# Patient Record
Sex: Male | Born: 1980 | Race: White | Hispanic: No | Marital: Married | Smoking: Current some day smoker
Health system: Southern US, Community
[De-identification: ages and names within clinical notes are randomized; demographics above are authoritative.]

## PROBLEM LIST (undated history)

## (undated) DIAGNOSIS — R519 Headache, unspecified: Secondary | ICD-10-CM

## (undated) DIAGNOSIS — G4730 Sleep apnea, unspecified: Secondary | ICD-10-CM

## (undated) DIAGNOSIS — R51 Headache: Secondary | ICD-10-CM

## (undated) DIAGNOSIS — M1990 Unspecified osteoarthritis, unspecified site: Secondary | ICD-10-CM

## (undated) DIAGNOSIS — I10 Essential (primary) hypertension: Secondary | ICD-10-CM

## (undated) DIAGNOSIS — F419 Anxiety disorder, unspecified: Secondary | ICD-10-CM

## (undated) HISTORY — PX: OTHER SURGICAL HISTORY: SHX169

## (undated) HISTORY — PX: LUMBAR FUSION: SHX111

## (undated) HISTORY — PX: PARTIAL NEPHRECTOMY: SHX414

---

## 2010-05-05 ENCOUNTER — Inpatient Hospital Stay (HOSPITAL_COMMUNITY): Admission: RE | Admit: 2010-05-05 | Discharge: 2010-05-08 | Payer: Self-pay | Admitting: Neurological Surgery

## 2010-06-21 ENCOUNTER — Encounter
Admission: RE | Admit: 2010-06-21 | Discharge: 2010-06-21 | Payer: Self-pay | Source: Home / Self Care | Attending: Neurological Surgery | Admitting: Neurological Surgery

## 2010-08-17 ENCOUNTER — Ambulatory Visit
Admission: RE | Admit: 2010-08-17 | Discharge: 2010-08-17 | Disposition: A | Discharge status: Home / Self Care | Payer: Worker's Compensation | Source: Ambulatory Visit | Attending: Neurological Surgery | Admitting: Neurological Surgery

## 2010-08-17 ENCOUNTER — Other Ambulatory Visit: Payer: Self-pay | Admitting: Neurological Surgery

## 2010-08-17 DIAGNOSIS — M549 Dorsalgia, unspecified: Secondary | ICD-10-CM

## 2010-08-24 LAB — COMPREHENSIVE METABOLIC PANEL
ALT: 52 U/L (ref 0–53)
AST: 31 U/L (ref 0–37)
Albumin: 4.3 g/dL (ref 3.5–5.2)
CO2: 30 mEq/L (ref 19–32)
Chloride: 101 mEq/L (ref 96–112)
GFR calc Af Amer: >60 mL/min (ref >60)
GFR calc non Af Amer: >60 mL/min (ref >60)
Potassium: 4 mEq/L (ref 3.5–5.1)
Sodium: 139 mEq/L (ref 135–145)
Total Bilirubin: 0.6 mg/dL (ref 0.3–1.2)

## 2010-08-24 LAB — CBC
Hemoglobin: 15.2 g/dL (ref 13.0–17.0)
RBC: 4.91 MIL/uL (ref 4.22–5.81)
WBC: 8.1 10*3/uL (ref 4.0–10.5)

## 2010-08-24 LAB — DIFFERENTIAL
Basophils Absolute: 0 10*3/uL (ref 0.0–0.1)
Eosinophils Absolute: 0.4 10*3/uL (ref 0.0–0.7)
Eosinophils Relative: 4 % (ref 0–5)
Monocytes Absolute: 0.5 10*3/uL (ref 0.1–1.0)

## 2010-08-24 LAB — ABO/RH: ABO/RH(D): A POS

## 2010-08-24 LAB — PROTIME-INR: Prothrombin Time: 12.9 seconds (ref 11.6–15.2)

## 2010-08-24 LAB — SURGICAL PCR SCREEN
MRSA, PCR: NEGATIVE
Staphylococcus aureus: NEGATIVE

## 2010-08-24 LAB — TYPE AND SCREEN: Antibody Screen: NEGATIVE

## 2010-11-29 ENCOUNTER — Ambulatory Visit
Admission: RE | Admit: 2010-11-29 | Discharge: 2010-11-29 | Disposition: A | Discharge status: Home / Self Care | Payer: Worker's Compensation | Source: Ambulatory Visit | Attending: Neurological Surgery | Admitting: Neurological Surgery

## 2010-11-29 ENCOUNTER — Other Ambulatory Visit: Payer: Self-pay | Admitting: Neurological Surgery

## 2010-11-29 DIAGNOSIS — M549 Dorsalgia, unspecified: Secondary | ICD-10-CM

## 2011-03-14 ENCOUNTER — Ambulatory Visit
Admission: RE | Admit: 2011-03-14 | Discharge: 2011-03-14 | Disposition: A | Discharge status: Home / Self Care | Payer: Worker's Compensation | Source: Ambulatory Visit | Attending: Neurological Surgery | Admitting: Neurological Surgery

## 2011-03-14 ENCOUNTER — Other Ambulatory Visit: Payer: Self-pay | Admitting: Neurological Surgery

## 2011-03-14 DIAGNOSIS — IMO0002 Reserved for concepts with insufficient information to code with codable children: Secondary | ICD-10-CM

## 2011-07-04 ENCOUNTER — Encounter (HOSPITAL_COMMUNITY): Payer: Self-pay | Admitting: Pharmacy Technician

## 2011-07-05 ENCOUNTER — Encounter (HOSPITAL_COMMUNITY): Payer: Self-pay

## 2011-07-05 ENCOUNTER — Encounter (HOSPITAL_COMMUNITY)
Admission: RE | Admit: 2011-07-05 | Discharge: 2011-07-05 | Disposition: A | Discharge status: Home / Self Care | Payer: Worker's Compensation | Source: Ambulatory Visit | Attending: Neurological Surgery | Admitting: Neurological Surgery

## 2011-07-05 HISTORY — DX: Sleep apnea, unspecified: G47.30

## 2011-07-05 HISTORY — DX: Essential (primary) hypertension: I10

## 2011-07-05 HISTORY — DX: Anxiety disorder, unspecified: F41.9

## 2011-07-05 LAB — CBC
HCT: 45.2 % (ref 39.0–52.0)
MCH: 31.7 pg (ref 26.0–34.0)
MCHC: 35 g/dL (ref 30.0–36.0)
MCV: 90.8 fL (ref 78.0–100.0)
Platelets: 200 10*3/uL (ref 150–400)
RDW: 13.3 % (ref 11.5–15.5)

## 2011-07-05 LAB — BASIC METABOLIC PANEL
CO2: 28 mEq/L (ref 19–32)
Calcium: 10 mg/dL (ref 8.4–10.5)
Creatinine, Ser: 1.11 mg/dL (ref 0.50–1.35)
GFR calc Af Amer: >90 mL/min (ref >90)
GFR calc non Af Amer: 89 mL/min — ABNORMAL LOW (ref >90)

## 2011-07-05 LAB — DIFFERENTIAL
Basophils Absolute: 0 10*3/uL (ref 0.0–0.1)
Basophils Relative: 0 % (ref 0–1)
Eosinophils Absolute: 0.3 10*3/uL (ref 0.0–0.7)
Eosinophils Relative: 3 % (ref 0–5)
Monocytes Absolute: 0.7 10*3/uL (ref 0.1–1.0)

## 2011-07-05 NOTE — Pre-Procedure Instructions (Signed)
20 DASH CARDARELLI  07/05/2011   Your procedure is scheduled on:  Thursday July 07, 2011  Report to [facility] at 1030 AM.  Call this number if you have problems the morning of surgery: [phone]   Remember:   Do not eat food:After Midnight.  May have clear liquids:until Midnight . (up to 6:30am)  Clear liquids include soda, tea, black coffee, apple or grape juice, broth.  Take these medicines the morning of surgery with A SIP OF WATER: xanax, amlodipine, celexa, gabapentin, hydrocodone   Do not wear jewelry, make-up or nail polish.  Do not wear lotions, powders, or perfumes. You may wear deodorant.  Do not shave 48 hours prior to surgery.  Do not bring valuables to the hospital.  Contacts, dentures or bridgework may not be worn into surgery.  Leave suitcase in the car. After surgery it may be brought to your room.  For patients admitted to the hospital, checkout time is 11:00 AM the day of discharge.   Patients discharged the day of surgery will not be allowed to drive home.  Name and phone number of your driver: Archer Moist  Special Instructions: CHG Shower Use Special Wash: 1/2 bottle night before surgery and 1/2 bottle morning of surgery.   Please read over the following fact sheets that you were given: Pain Booklet, Coughing and Deep Breathing, Blood Transfusion Information, MRSA Information and Surgical Site Infection Prevention

## 2011-07-05 NOTE — Progress Notes (Signed)
Echo requested from [facility]. CXR, EKG requested from [facility] [place]. Request cardiology notes from [facility] [place].

## 2011-07-06 MED ORDER — CEFAZOLIN SODIUM-DEXTROSE 2-3 GM-% IV SOLR
2.0000 g | INTRAVENOUS | Status: AC
  Administered 2011-07-07: 2 g via INTRAVENOUS
  Filled 2011-07-06: qty 50

## 2011-07-07 ENCOUNTER — Ambulatory Visit (HOSPITAL_COMMUNITY): Payer: Worker's Compensation

## 2011-07-07 ENCOUNTER — Encounter (HOSPITAL_COMMUNITY)
Admission: RE | Disposition: A | Discharge status: Home / Self Care | Payer: Self-pay | Source: Ambulatory Visit | Attending: Neurological Surgery

## 2011-07-07 ENCOUNTER — Encounter (HOSPITAL_COMMUNITY): Payer: Self-pay | Admitting: Anesthesiology

## 2011-07-07 ENCOUNTER — Ambulatory Visit (HOSPITAL_COMMUNITY): Payer: Worker's Compensation | Admitting: Anesthesiology

## 2011-07-07 ENCOUNTER — Ambulatory Visit (HOSPITAL_COMMUNITY)
Admission: RE | Admit: 2011-07-07 | Discharge: 2011-07-08 | Disposition: A | Discharge status: Home / Self Care | Payer: Worker's Compensation | Source: Ambulatory Visit | Attending: Neurological Surgery | Admitting: Neurological Surgery

## 2011-07-07 ENCOUNTER — Encounter (HOSPITAL_COMMUNITY): Payer: Self-pay | Admitting: Neurological Surgery

## 2011-07-07 ENCOUNTER — Encounter (HOSPITAL_COMMUNITY): Payer: Self-pay | Admitting: *Deleted

## 2011-07-07 ENCOUNTER — Other Ambulatory Visit: Payer: Self-pay

## 2011-07-07 DIAGNOSIS — Y831 Surgical operation with implant of artificial internal device as the cause of abnormal reaction of the patient, or of later complication, without mention of misadventure at the time of the procedure: Secondary | ICD-10-CM | POA: Insufficient documentation

## 2011-07-07 DIAGNOSIS — G4730 Sleep apnea, unspecified: Secondary | ICD-10-CM | POA: Insufficient documentation

## 2011-07-07 DIAGNOSIS — T8489XA Other specified complication of internal orthopedic prosthetic devices, implants and grafts, initial encounter: Secondary | ICD-10-CM | POA: Insufficient documentation

## 2011-07-07 DIAGNOSIS — I10 Essential (primary) hypertension: Secondary | ICD-10-CM | POA: Insufficient documentation

## 2011-07-07 DIAGNOSIS — F411 Generalized anxiety disorder: Secondary | ICD-10-CM | POA: Insufficient documentation

## 2011-07-07 DIAGNOSIS — M549 Dorsalgia, unspecified: Secondary | ICD-10-CM

## 2011-07-07 DIAGNOSIS — I499 Cardiac arrhythmia, unspecified: Secondary | ICD-10-CM | POA: Insufficient documentation

## 2011-07-07 DIAGNOSIS — Z01812 Encounter for preprocedural laboratory examination: Secondary | ICD-10-CM | POA: Insufficient documentation

## 2011-07-07 SURGERY — POSTERIOR LUMBAR FUSION 1 WITH HARDWARE REMOVAL
Anesthesia: General | Site: Back | Wound class: Clean

## 2011-07-07 MED ORDER — SODIUM CHLORIDE 0.9 % IJ SOLN: 3.0000 mL | INTRAMUSCULAR | Status: DC | PRN

## 2011-07-07 MED ORDER — DEXAMETHASONE SODIUM PHOSPHATE 10 MG/ML IJ SOLN
10.0000 mg | Freq: Once | INTRAMUSCULAR | Status: AC
  Administered 2011-07-07: 10 mg via INTRAVENOUS
  Filled 2011-07-07: qty 1

## 2011-07-07 MED ORDER — CEFAZOLIN SODIUM 1-5 GM-% IV SOLN
1.0000 g | Freq: Three times a day (TID) | INTRAVENOUS | Status: AC
  Administered 2011-07-07 (×2): 1 g via INTRAVENOUS
  Filled 2011-07-07 (×2): qty 50

## 2011-07-07 MED ORDER — SODIUM CHLORIDE 0.9 % IV SOLN
INTRAVENOUS | Status: AC
  Filled 2011-07-07: qty 500

## 2011-07-07 MED ORDER — HYDROMORPHONE HCL PF 1 MG/ML IJ SOLN
INTRAMUSCULAR | Status: AC
  Filled 2011-07-07: qty 1

## 2011-07-07 MED ORDER — 0.9 % SODIUM CHLORIDE (POUR BTL) OPTIME
TOPICAL | Status: DC | PRN
  Administered 2011-07-07: 1000 mL

## 2011-07-07 MED ORDER — SODIUM CHLORIDE 0.9 % IJ SOLN
3.0000 mL | Freq: Two times a day (BID) | INTRAMUSCULAR | Status: DC
  Administered 2011-07-07 (×2): 3 mL via INTRAVENOUS

## 2011-07-07 MED ORDER — PHENOL 1.4 % MT LIQD: 1.0000 | OROMUCOSAL | Status: DC | PRN

## 2011-07-07 MED ORDER — MORPHINE SULFATE 4 MG/ML IJ SOLN: 1.0000 mg | INTRAMUSCULAR | Status: DC | PRN

## 2011-07-07 MED ORDER — LISINOPRIL 10 MG PO TABS
10.0000 mg | ORAL_TABLET | Freq: Every day | ORAL | Status: DC
  Administered 2011-07-07: 10 mg via ORAL
  Filled 2011-07-07 (×2): qty 1

## 2011-07-07 MED ORDER — CITALOPRAM HYDROBROMIDE 10 MG PO TABS
10.0000 mg | ORAL_TABLET | Freq: Every day | ORAL | Status: DC
  Administered 2011-07-07 (×2): 10 mg via ORAL
  Filled 2011-07-07 (×2): qty 1

## 2011-07-07 MED ORDER — BUPIVACAINE HCL (PF) 0.25 % IJ SOLN
INTRAMUSCULAR | Status: DC | PRN
  Administered 2011-07-07: 7 mL

## 2011-07-07 MED ORDER — ONDANSETRON HCL 4 MG/2ML IJ SOLN
INTRAMUSCULAR | Status: DC | PRN
  Administered 2011-07-07: 4 mg via INTRAVENOUS

## 2011-07-07 MED ORDER — KETOROLAC TROMETHAMINE 30 MG/ML IJ SOLN
INTRAMUSCULAR | Status: DC | PRN
  Administered 2011-07-07: 30 mg via INTRAVENOUS

## 2011-07-07 MED ORDER — ALPRAZOLAM 0.25 MG PO TABS: 0.2500 mg | ORAL_TABLET | Freq: Every day | ORAL | Status: DC | PRN

## 2011-07-07 MED ORDER — ONDANSETRON HCL 4 MG/2ML IJ SOLN
4.0000 mg | INTRAMUSCULAR | Status: DC | PRN
  Filled 2011-07-07: qty 2

## 2011-07-07 MED ORDER — GLYCOPYRROLATE 0.2 MG/ML IJ SOLN
INTRAMUSCULAR | Status: DC | PRN
  Administered 2011-07-07: .4 mg via INTRAVENOUS

## 2011-07-07 MED ORDER — POTASSIUM CHLORIDE IN NACL 20-0.9 MEQ/L-% IV SOLN
INTRAVENOUS | Status: DC
  Filled 2011-07-07 (×3): qty 1000

## 2011-07-07 MED ORDER — PROMETHAZINE HCL 25 MG/ML IJ SOLN: 6.2500 mg | INTRAMUSCULAR | Status: DC | PRN

## 2011-07-07 MED ORDER — LACTATED RINGERS IV SOLN
INTRAVENOUS | Status: DC | PRN
  Administered 2011-07-07 (×2): via INTRAVENOUS

## 2011-07-07 MED ORDER — SURGIFOAM 100 EX MISC
CUTANEOUS | Status: DC | PRN
  Administered 2011-07-07: 11:00:00 via TOPICAL

## 2011-07-07 MED ORDER — ACETAMINOPHEN 325 MG PO TABS: 650.0000 mg | ORAL_TABLET | ORAL | Status: DC | PRN

## 2011-07-07 MED ORDER — NEOSTIGMINE METHYLSULFATE 1 MG/ML IJ SOLN
INTRAMUSCULAR | Status: DC | PRN
  Administered 2011-07-07: 3 mg via INTRAVENOUS

## 2011-07-07 MED ORDER — HYDROMORPHONE HCL PF 1 MG/ML IJ SOLN
0.2500 mg | INTRAMUSCULAR | Status: DC | PRN
Start: 2011-07-07 — End: 2011-07-07
  Administered 2011-07-07 (×4): 0.5 mg via INTRAVENOUS

## 2011-07-07 MED ORDER — ROCURONIUM BROMIDE 100 MG/10ML IV SOLN
INTRAVENOUS | Status: DC | PRN
  Administered 2011-07-07: 5 mg via INTRAVENOUS
  Administered 2011-07-07: 60 mg via INTRAVENOUS

## 2011-07-07 MED ORDER — SENNA 8.6 MG PO TABS
1.0000 | ORAL_TABLET | Freq: Two times a day (BID) | ORAL | Status: DC
  Administered 2011-07-07: 8.6 mg via ORAL
  Filled 2011-07-07 (×3): qty 1

## 2011-07-07 MED ORDER — MEPERIDINE HCL 25 MG/ML IJ SOLN: 6.2500 mg | INTRAMUSCULAR | Status: DC | PRN

## 2011-07-07 MED ORDER — ACETAMINOPHEN 650 MG RE SUPP: 650.0000 mg | RECTAL | Status: DC | PRN

## 2011-07-07 MED ORDER — PROMETHAZINE HCL 25 MG PO TABS: 25.0000 mg | ORAL_TABLET | Freq: Four times a day (QID) | ORAL | Status: DC | PRN

## 2011-07-07 MED ORDER — AMLODIPINE BESYLATE 2.5 MG PO TABS
2.5000 mg | ORAL_TABLET | Freq: Every day | ORAL | Status: DC
  Administered 2011-07-07: 2.5 mg via ORAL
  Filled 2011-07-07 (×2): qty 1

## 2011-07-07 MED ORDER — MENTHOL 3 MG MT LOZG: 1.0000 | LOZENGE | OROMUCOSAL | Status: DC | PRN

## 2011-07-07 MED ORDER — BACITRACIN 50000 UNITS IM SOLR
INTRAMUSCULAR | Status: AC
  Filled 2011-07-07: qty 1

## 2011-07-07 MED ORDER — SODIUM CHLORIDE 0.9 % IR SOLN
Status: DC | PRN
  Administered 2011-07-07: 11:00:00

## 2011-07-07 MED ORDER — HYDROCODONE-ACETAMINOPHEN 5-325 MG PO TABS
1.0000 | ORAL_TABLET | ORAL | Status: DC | PRN
  Administered 2011-07-07: 2 via ORAL
  Filled 2011-07-07: qty 2

## 2011-07-07 MED ORDER — MELOXICAM 7.5 MG PO TABS
7.5000 mg | ORAL_TABLET | Freq: Two times a day (BID) | ORAL | Status: DC | PRN
  Filled 2011-07-07: qty 1

## 2011-07-07 MED ORDER — PROMETHAZINE HCL 25 MG/ML IJ SOLN: 25.0000 mg | Freq: Four times a day (QID) | INTRAMUSCULAR | Status: DC | PRN

## 2011-07-07 MED ORDER — MIDAZOLAM HCL 5 MG/5ML IJ SOLN
INTRAMUSCULAR | Status: DC | PRN
  Administered 2011-07-07: 2 mg via INTRAVENOUS

## 2011-07-07 MED ORDER — FENTANYL CITRATE 0.05 MG/ML IJ SOLN
INTRAMUSCULAR | Status: DC | PRN
  Administered 2011-07-07: 50 ug via INTRAVENOUS
  Administered 2011-07-07: 100 ug via INTRAVENOUS
  Administered 2011-07-07: 50 ug via INTRAVENOUS

## 2011-07-07 MED ORDER — ZOLPIDEM TARTRATE 5 MG PO TABS: 10.0000 mg | ORAL_TABLET | Freq: Every evening | ORAL | Status: DC | PRN

## 2011-07-07 MED ORDER — PROPOFOL 10 MG/ML IV EMUL
INTRAVENOUS | Status: DC | PRN
  Administered 2011-07-07: 250 mg via INTRAVENOUS

## 2011-07-07 MED ORDER — GABAPENTIN 600 MG PO TABS
600.0000 mg | ORAL_TABLET | Freq: Three times a day (TID) | ORAL | Status: DC
  Administered 2011-07-07 (×2): 600 mg via ORAL
  Filled 2011-07-07 (×5): qty 1

## 2011-07-07 MED ORDER — KETOROLAC TROMETHAMINE 30 MG/ML IJ SOLN: 15.0000 mg | Freq: Once | INTRAMUSCULAR | Status: DC | PRN

## 2011-07-07 MED ORDER — CYCLOBENZAPRINE HCL 10 MG PO TABS
10.0000 mg | ORAL_TABLET | Freq: Three times a day (TID) | ORAL | Status: DC | PRN
  Administered 2011-07-07: 10 mg via ORAL
  Filled 2011-07-07: qty 1

## 2011-07-07 SURGICAL SUPPLY — 45 items
BAG DECANTER FOR FLEXI CONT (MISCELLANEOUS) ×2 IMPLANT
BENZOIN TINCTURE PRP APPL 2/3 (GAUZE/BANDAGES/DRESSINGS) ×2 IMPLANT
BLADE SURG ROTATE 9660 (MISCELLANEOUS) IMPLANT
BUR MATCHSTICK NEURO 3.0 LAGG (BURR) ×2 IMPLANT
CANISTER SUCTION 2500CC (MISCELLANEOUS) ×2 IMPLANT
CLOSURE STERI STRIP 1/2 X4 (GAUZE/BANDAGES/DRESSINGS) ×2 IMPLANT
CLOTH BEACON ORANGE TIMEOUT ST (SAFETY) ×2 IMPLANT
CONT SPEC 4OZ CLIKSEAL STRL BL (MISCELLANEOUS) ×4 IMPLANT
COVER BACK TABLE 24X17X13 BIG (DRAPES) IMPLANT
COVER TABLE BACK 60X90 (DRAPES) ×2 IMPLANT
DRAPE C-ARM 42X72 X-RAY (DRAPES) ×4 IMPLANT
DRAPE LAPAROTOMY 100X72X124 (DRAPES) ×2 IMPLANT
DRAPE POUCH INSTRU U-SHP 10X18 (DRAPES) ×2 IMPLANT
DRAPE SURG 17X23 STRL (DRAPES) ×2 IMPLANT
DRESSING TELFA 8X3 (GAUZE/BANDAGES/DRESSINGS) ×2 IMPLANT
DRSG OPSITE 4X5.5 SM (GAUZE/BANDAGES/DRESSINGS) ×2 IMPLANT
DURAPREP 26ML APPLICATOR (WOUND CARE) ×2 IMPLANT
ELECT REM PT RETURN 9FT ADLT (ELECTROSURGICAL) ×2
ELECTRODE REM PT RTRN 9FT ADLT (ELECTROSURGICAL) ×1 IMPLANT
EVACUATOR 1/8 PVC DRAIN (DRAIN) ×4 IMPLANT
GAUZE SPONGE 4X4 16PLY XRAY LF (GAUZE/BANDAGES/DRESSINGS) IMPLANT
GLOVE BIO SURGEON STRL SZ8 (GLOVE) ×4 IMPLANT
GOWN BRE IMP SLV AUR LG STRL (GOWN DISPOSABLE) IMPLANT
GOWN BRE IMP SLV AUR XL STRL (GOWN DISPOSABLE) ×4 IMPLANT
GOWN STRL REIN 2XL LVL4 (GOWN DISPOSABLE) IMPLANT
HEMOSTAT POWDER KIT SURGIFOAM (HEMOSTASIS) IMPLANT
KIT BASIN OR (CUSTOM PROCEDURE TRAY) ×2 IMPLANT
KIT ROOM TURNOVER OR (KITS) ×2 IMPLANT
NEEDLE HYPO 25X1 1.5 SAFETY (NEEDLE) ×2 IMPLANT
NS IRRIG 1000ML POUR BTL (IV SOLUTION) ×2 IMPLANT
PACK LAMINECTOMY NEURO (CUSTOM PROCEDURE TRAY) ×2 IMPLANT
PAD ARMBOARD 7.5X6 YLW CONV (MISCELLANEOUS) ×6 IMPLANT
SPONGE LAP 4X18 X RAY DECT (DISPOSABLE) IMPLANT
SPONGE SURGIFOAM ABS GEL 100 (HEMOSTASIS) ×2 IMPLANT
STRIP CLOSURE SKIN 1/2X4 (GAUZE/BANDAGES/DRESSINGS) ×4 IMPLANT
SUT VIC AB 0 CT1 18XCR BRD8 (SUTURE) ×1 IMPLANT
SUT VIC AB 0 CT1 8-18 (SUTURE) ×1
SUT VIC AB 2-0 CP2 18 (SUTURE) ×2 IMPLANT
SUT VIC AB 3-0 SH 8-18 (SUTURE) ×4 IMPLANT
SYR 20ML ECCENTRIC (SYRINGE) ×2 IMPLANT
TOWEL OR 17X24 6PK STRL BLUE (TOWEL DISPOSABLE) ×2 IMPLANT
TOWEL OR 17X26 10 PK STRL BLUE (TOWEL DISPOSABLE) ×2 IMPLANT
TRAP SPECIMEN MUCOUS 40CC (MISCELLANEOUS) IMPLANT
TRAY FOLEY CATH 14FRSI W/METER (CATHETERS) ×2 IMPLANT
WATER STERILE IRR 1000ML POUR (IV SOLUTION) ×2 IMPLANT

## 2011-07-07 NOTE — H&P (Signed)
Subjective: Patient is a 31 y.o. male admitted for removal of painful hardware. Onset of symptoms was several months ago, unchanged since that time.  The pain is rated moderate, and is located at the across the lower back. He is status post PLIF at L5-S1 at L4-5 over a year ago. The pain is described as aching and occurs intermittently. The symptoms have been progressive. Symptoms are exacerbated by exercise. MRI or CT showed solid fusion at both levels.   Past Medical History  Diagnosis Date  . Sleep apnea     Uses CPAP sleep study 6-8 month at [facility] Copy requested  . Hypertension     Echo done at [facility], Also seen a cardiologist at [facility] for one visit  . Dysrhythmia     Unsure rhythm attributed to sleep apena  . Anxiety     Past Surgical History  Procedure Date  . Partial nephrectomy   . Lumbar fusion   . Orif hand     Prior to Admission medications   Medication Sig Start Date End Date Taking? Authorizing Provider  ALPRAZolam (XANAX) 0.25 MG tablet Take 0.25 mg by mouth daily as needed. anxiety   Yes Historical Provider, MD  amLODipine (NORVASC) 2.5 MG tablet Take 2.5 mg by mouth daily.   Yes Historical Provider, MD  citalopram (CELEXA) 10 MG tablet Take 10 mg by mouth daily.   Yes Historical Provider, MD  cyclobenzaprine (FLEXERIL) 10 MG tablet Take 10 mg by mouth 3 (three) times daily as needed. Muscle spasm   Yes Historical Provider, MD  gabapentin (NEURONTIN) 600 MG tablet Take 600 mg by mouth 3 (three) times daily.   Yes Historical Provider, MD  HYDROcodone-acetaminophen (VICODIN) 5-500 MG per tablet Take 1-2 tablets by mouth every 8 (eight) hours as needed. pain   Yes Historical Provider, MD  lisinopril (PRINIVIL,ZESTRIL) 10 MG tablet Take 10 mg by mouth daily.   Yes Historical Provider, MD  meloxicam (MOBIC) 7.5 MG tablet Take 7.5 mg by mouth 2 (two) times daily as needed. pain   Yes Historical Provider, MD  promethazine (PHENERGAN) 25 MG  tablet Take 25 mg by mouth every 6 (six) hours as needed. Nausea   Yes Historical Provider, MD   Allergies  Allergen Reactions  . Hctz (Hydrochlorothiazide) Other (See Comments)    Hypotension, Syncope    History  Substance Use Topics  . Smoking status: Current Some Day Smoker  . Smokeless tobacco: Current User    Types: Chew, Snuff  . Alcohol Use:      1 drink a drink    Family History  Problem Relation Age of Onset  . Anesthesia problems Neg Hx      Review of Systems  Positive ROS: neg  All other systems have been reviewed and were otherwise negative with the exception of those mentioned in the HPI and as above.  Objective: Vital signs in last 24 hours: Temp:  [98 F (36.7 C)] 98 F (36.7 C) (01/24 0712) Pulse Rate:  [98] 98  (01/24 0712) Resp:  [20] 20  (01/24 0712) BP: (141)/(82) 141/82 mmHg (01/24 0712) SpO2:  [95 %] 95 % (01/24 0712)  General Appearance: Alert, cooperative, no distress, appears stated age Head: Normocephalic, without obvious abnormality, atraumatic Eyes: PERRL, conjunctiva/corneas clear, EOM's intact, fundi benign, both eyes      Ears: Normal TM's and external ear canals, both ears Throat: Lips, mucosa, and tongue normal; teeth and gums normal Neck: Supple, symmetrical, trachea midline,  no adenopathy; thyroid: No enlargement/tenderness/nodules; no carotid bruit or JVD Back: Symmetric, no curvature, ROM normal, no CVA tenderness Lungs: Clear to auscultation bilaterally, respirations unlabored Heart: Regular rate and rhythm, S1 and S2 normal, no murmur, rub or gallop Abdomen: Soft, non-tender, bowel sounds active all four quadrants, no masses, no organomegaly Extremities: Extremities normal, atraumatic, no cyanosis or edema Pulses: 2+ and symmetric all extremities Skin: Skin color, texture, turgor normal, no rashes or lesions  NEUROLOGIC:   Mental status: Alert and oriented x4,  no aphasia, good attention span, fund of knowledge, and  memory Motor Exam - grossly normal Sensory Exam - grossly normal Reflexes: Normal Coordination - grossly normal Gait - grossly normal Balance - grossly normal Cranial Nerves: I: smell Not tested  II: visual acuity  OS: nl    OD: nl  II: visual fields Full to confrontation  II: pupils Equal, round, reactive to light  III,VII: ptosis None  III,IV,VI: extraocular muscles  Full ROM  V: mastication Normal  V: facial light touch sensation  Normal  V,VII: corneal reflex  Present  VII: facial muscle function - upper  Normal  VII: facial muscle function - lower Normal  VIII: hearing Not tested  IX: soft palate elevation  Normal  IX,X: gag reflex Present  XI: trapezius strength  5/5  XI: sternocleidomastoid strength 5/5  XI: neck flexion strength  5/5  XII: tongue strength  Normal    Data Review Lab Results  Component Value Date   WBC 8.5 07/05/2011   HGB 15.8 07/05/2011   HCT 45.2 07/05/2011   MCV 90.8 07/05/2011   PLT 200 07/05/2011   Lab Results  Component Value Date   NA 137 07/05/2011   K 4.1 07/05/2011   CL 100 07/05/2011   CO2 28 07/05/2011   BUN 11 07/05/2011   CREATININE 1.11 07/05/2011   GLUCOSE 97 07/05/2011   Lab Results  Component Value Date   INR 0.95 04/30/2010    Assessment/Plan: Patient admitted for lumbar reexploration with removal of hardware. Patient has failed conservative therapy.  I explained the condition and procedure to the patient and answered any questions.  Patient wishes to proceed with procedure as planned. Understands risks/ benefits and typical outcomes of procedure.   JONES,DAVID S 07/07/2011 10:09 AM

## 2011-07-07 NOTE — Anesthesia Preprocedure Evaluation (Signed)
Anesthesia Evaluation  Patient identified by MRN, date of birth, ID band Patient awake    Reviewed: Allergy & Precautions, H&P , NPO status , Patient's Chart, lab work & pertinent test results  History of Anesthesia Complications Negative for: history of anesthetic complications  Airway Mallampati: I  Neck ROM: Full    Dental  (+) Teeth Intact and Dental Advisory Given   Pulmonary sleep apnea ,  clear to auscultation        Cardiovascular hypertension, + dysrhythmias Regular Normal    Neuro/Psych Anxiety    GI/Hepatic negative GI ROS, Neg liver ROS,   Endo/Other    Renal/GU negative Renal ROS     Musculoskeletal   Abdominal   Peds  Hematology   Anesthesia Other Findings   Reproductive/Obstetrics                           Anesthesia Physical Anesthesia Plan  ASA: II  Anesthesia Plan: General   Post-op Pain Management:    Induction: Intravenous  Airway Management Planned: Oral ETT  Additional Equipment:   Intra-op Plan:   Post-operative Plan: Extubation in OR  Informed Consent: I have reviewed the patients History and Physical, chart, labs and discussed the procedure including the risks, benefits and alternatives for the proposed anesthesia with the patient or authorized representative who has indicated his/her understanding and acceptance.     Plan Discussed with: CRNA and Surgeon  Anesthesia Plan Comments:         Anesthesia Quick Evaluation

## 2011-07-07 NOTE — Op Note (Signed)
07/07/2011  12:17 PM  PATIENT:  Chad Kane  31 y.o. male  PRE-OPERATIVE DIAGNOSIS:  Painful hardware status post L4-S1 fusion  POST-OPERATIVE DIAGNOSIS:  Same  PROCEDURE:  Lumbar reexploration with exploration of L4-S1 fusion to assure no pseudoarthrosis, with removal of segmental hardware L4-S1  SURGEON:  Marikay Alar, MD  ASSISTANTS: Dr. Gerlene Fee  ANESTHESIA:   General  EBL: 100 ml  Total I/O In: 1000 [I.V.:1000] Out: 100 [Blood:100]  BLOOD ADMINISTERED:none  DRAINS: Medium Hemovac   SPECIMEN:  No Specimen  INDICATION FOR PROCEDURE: This is a 31 year old gentleman who underwent a 2 level PLIF over a year ago. He has some chronic back. Because of his youth and because of his chronic back pain I felt might be beneficial to remove the hardware. Patient understood the risks, benefits, and alternatives and potential outcomes and wished to proceed.  PROCEDURE DETAILS: The patient was taken to the operating room and after induction of adequate generalized endotracheal anesthesia he was rolled into the prone position on the Wilson frame and all pressure points were padded. His lumbar region was prepped with DuraPrep and then draped in the usual sterile fashion. 10 cc of local anesthesia was injected and his old incision was ellipsed out. I then carried my dissection down to the fascia to identify the L3 minus process. Integument dissection out laterally to identify the tulip heads of the pedicle screws. I removed the soft tissue from the surface of the tulip heads and removed the locking caps. I then removed the rods. I then pulled on the screws to assure all screws moved in unison to assess my fusion. I then removed each screw him L4-S1. Irrigated with saline solution containing bacitracin. I placed Gelfoam in the screw holes. I placed a medium Hemovac drain through separate stab incision. I then closed the muscle and the fascia with 0 Vicryl. I closed the subcutaneous and subcuticular  tissue with 2-0 and 3-0 Vicryl. The skin was closed with benzoin and Steri-Strips. The drapes were removed and a sterile dressing was applied. Patient was awakened from general anesthesia and taken to the recovery room in stable condition. At the end of the procedure all sponge needle and instrument counts were correct.  PLAN OF CARE: Admit for overnight observation  PATIENT DISPOSITION:  PACU - hemodynamically stable.   Delay start of Pharmacological VTE agent (>24hrs) due to surgical blood loss or risk of bleeding:  yes

## 2011-07-07 NOTE — Anesthesia Postprocedure Evaluation (Signed)
  Anesthesia Post-op Note  Patient: Chad Kane  Procedure(s) Performed:  POSTERIOR LUMBAR FUSION 1 WITH HARDWARE REMOVAL - Removal of lumbar hardware Biomet removal set Rm # 33 (to follow)  Patient Location: PACU  Anesthesia Type: General  Level of Consciousness: awake  Airway and Oxygen Therapy: Patient Spontanous Breathing  Post-op Pain: mild  Post-op Assessment: Post-op Vital signs reviewed  Post-op Vital Signs: stable  Complications: No apparent anesthesia complications

## 2011-07-07 NOTE — Progress Notes (Signed)
Patient ID: Chad Kane, male   DOB: [birth year], 31 y.o.   MRN: [id number]   Pt doing well post-op. Back sore. No leg sxs. Home tomorrow.

## 2011-07-07 NOTE — Preoperative (Signed)
Beta Blockers   Reason not to administer Beta Blockers:Not Applicable 

## 2011-07-07 NOTE — Plan of Care (Signed)
Problem: Consults Goal: Diagnosis - Spinal Surgery Outcome: Completed/Met Date Met:  07/07/11 Thoraco/Lumbar Spine Fusion Removal of hardware

## 2011-07-07 NOTE — Transfer of Care (Signed)
Immediate Anesthesia Transfer of Care Note  Patient: Chad Kane  Procedure(s) Performed:  POSTERIOR LUMBAR FUSION 1 WITH HARDWARE REMOVAL - Removal of lumbar hardware Biomet removal set Rm # 33 (to follow)  Patient Location: PACU  Anesthesia Type: General  Level of Consciousness: awake, alert  and oriented  Airway & Oxygen Therapy: Patient Spontanous Breathing and Patient connected to nasal cannula oxygen  Post-op Assessment: Report given to PACU RN and Post -op Vital signs reviewed and stable  Post vital signs: Reviewed  Complications: No apparent anesthesia complications

## 2011-07-07 NOTE — Anesthesia Procedure Notes (Signed)
Procedure Name: Intubation Date/Time: 07/07/2011 10:52 AM Performed by: Julianne Rice K Pre-anesthesia Checklist: Emergency Drugs available, Patient identified, Timeout performed, Suction available and Patient being monitored Patient Re-evaluated:Patient Re-evaluated prior to inductionOxygen Delivery Method: Circle System Utilized Preoxygenation: Pre-oxygenation with 100% oxygen Intubation Type: IV induction Ventilation: Mask ventilation without difficulty Laryngoscope Size: Mac and 4 Grade View: Grade II Tube type: Oral Tube size: 8.5 mm Number of attempts: 1 Airway Equipment and Method: stylet Secured at: 23 cm Tube secured with: Tape Dental Injury: Teeth and Oropharynx as per pre-operative assessment

## 2011-07-08 NOTE — Discharge Summary (Signed)
Physician Discharge Summary  Patient ID: Chad Kane MRN: [id number] DOB/AGE: [birth year] 29 y.o.  Admit date: 07/07/2011 Discharge date: 07/08/2011  Admission Diagnoses: painful hardware    Discharge Diagnoses: same   Discharged Condition: good  Hospital Course: The patient was admitted on 07/07/2011 and taken to the operating room where the patient underwent hardware removal. The patient tolerated the procedure well and was taken to the recovery room and then to the floor in stable condition. The hospital course was routine. There were no complications. The wound remained clean dry and intact. The patient remained afebrile with stable vital signs, and tolerated a regular diet. The patient continued to increase activities, and pain was well controlled with oral pain medications.   Consults: None  Significant Diagnostic Studies:  Results for orders placed during the hospital encounter of 07/05/11  SURGICAL PCR SCREEN      Component Value Range   MRSA, PCR NEGATIVE  NEGATIVE    Staphylococcus aureus NEGATIVE  NEGATIVE   CBC      Component Value Range   WBC 8.5  4.0 - 10.5 (K/uL)   RBC 4.98  4.22 - 5.81 (MIL/uL)   Hemoglobin 15.8  13.0 - 17.0 (g/dL)   HCT 46.9  62.9 - 52.8 (%)   MCV 90.8  78.0 - 100.0 (fL)   MCH 31.7  26.0 - 34.0 (pg)   MCHC 35.0  30.0 - 36.0 (g/dL)   RDW 41.3  24.4 - 01.0 (%)   Platelets 200  150 - 400 (K/uL)  DIFFERENTIAL      Component Value Range   Neutrophils Relative 65  43 - 77 (%)   Neutro Abs 5.5  1.7 - 7.7 (K/uL)   Lymphocytes Relative 23  12 - 46 (%)   Lymphs Abs 1.9  0.7 - 4.0 (K/uL)   Monocytes Relative 8  3 - 12 (%)   Monocytes Absolute 0.7  0.1 - 1.0 (K/uL)   Eosinophils Relative 3  0 - 5 (%)   Eosinophils Absolute 0.3  0.0 - 0.7 (K/uL)   Basophils Relative 0  0 - 1 (%)   Basophils Absolute 0.0  0.0 - 0.1 (K/uL)  BASIC METABOLIC PANEL      Component Value Range   Sodium 137  135 - 145 (mEq/L)   Potassium 4.1  3.5 - 5.1 (mEq/L)   Chloride 100  96 - 112 (mEq/L)   CO2 28  19 - 32 (mEq/L)   Glucose, Bld 97  70 - 99 (mg/dL)   BUN 11  6 - 23 (mg/dL)   Creatinine, Ser 2.72  0.50 - 1.35 (mg/dL)   Calcium 53.6  8.4 - 10.5 (mg/dL)   GFR calc non Af Amer 89 (*) >90 (mL/min)   GFR calc Af Amer >90  >90 (mL/min)    Dg Chest 2 View  07/07/2011  *RADIOLOGY REPORT*  Clinical Data: Sleep apnea.  Hypertension.  Dysrhythmia.  Posterior lumbar fusion, preoperative.  CHEST - 2 VIEW  Comparison: 04/30/2010  Findings: Cardiac and mediastinal contours appear normal.  The lungs appear clear.  No pleural effusion is identified.  IMPRESSION:  No significant abnormality identified.  Original Report Authenticated By: Dellia Cloud, M.D.    Antibiotics:  Anti-infectives     Start     Dose/Rate Route Frequency Ordered Stop   07/07/11 1500   ceFAZolin (ANCEF) IVPB 1 g/50 mL premix        1 g 100 mL/hr over 30 Minutes Intravenous 3 times per day  07/07/11 1421 07/07/11 2201   07/07/11 1037   bacitracin 50,000 Units in sodium chloride irrigation 0.9 % 500 mL irrigation  Status:  Discontinued          As needed 07/07/11 1037 07/07/11 1222   07/07/11 1031   bacitracin 16109 UNITS injection     Comments: [name], AMY: cabinet override         07/07/11 1031 07/07/11 2244   07/06/11 1445   ceFAZolin (ANCEF) IVPB 2 g/50 mL premix        2 g 100 mL/hr over 30 Minutes Intravenous 60 min pre-op 07/06/11 1433 07/07/11 1045          Discharge Exam: Blood pressure 137/84, pulse 94, temperature 97.8 F (36.6 C), temperature source Oral, resp. rate 18, SpO2 96.00%. Neurologic: Grossly normal Wound:CDI  Discharge Medications:   Medication List  As of 07/08/2011  8:32 AM   TAKE these medications         ALPRAZolam 0.25 MG tablet   Commonly known as: XANAX   Take 0.25 mg by mouth daily as needed. anxiety      amLODipine 2.5 MG tablet   Commonly known as: NORVASC   Take 2.5 mg by mouth daily.      citalopram 10 MG tablet   Commonly  known as: CELEXA   Take 10 mg by mouth daily.      cyclobenzaprine 10 MG tablet   Commonly known as: FLEXERIL   Take 10 mg by mouth 3 (three) times daily as needed. Muscle spasm      gabapentin 600 MG tablet   Commonly known as: NEURONTIN   Take 600 mg by mouth 3 (three) times daily.      HYDROcodone-acetaminophen 5-500 MG per tablet   Commonly known as: VICODIN   Take 1-2 tablets by mouth every 8 (eight) hours as needed. pain      lisinopril 10 MG tablet   Commonly known as: PRINIVIL,ZESTRIL   Take 10 mg by mouth daily.      meloxicam 7.5 MG tablet   Commonly known as: MOBIC   Take 7.5 mg by mouth 2 (two) times daily as needed. pain      promethazine 25 MG tablet   Commonly known as: PHENERGAN   Take 25 mg by mouth every 6 (six) hours as needed. Nausea            Disposition: home   Final Dx: hardware removal, lumbar  Discharge Orders    Future Orders Please Complete By Expires   Diet - low sodium heart healthy      Increase activity slowly      Driving Restrictions      Comments:   1 week   Call MD for:  temperature >100.4      Call MD for:  persistant nausea and vomiting      Call MD for:  severe uncontrolled pain      Call MD for:  redness, tenderness, or signs of infection (pain, swelling, redness, odor or green/yellow discharge around incision site)      Call MD for:  difficulty breathing, headache or visual disturbances         Follow-up Information    Follow up with Chad Trinidad S, MD in 3 weeks.   Contact information:   [address] [place] [address] [phone]           Signed: Tia Alert 07/08/2011, 8:32 AM

## 2017-05-12 ENCOUNTER — Other Ambulatory Visit: Payer: Self-pay | Admitting: Student

## 2017-05-12 DIAGNOSIS — M5442 Lumbago with sciatica, left side: Secondary | ICD-10-CM

## 2017-05-12 DIAGNOSIS — Z77018 Contact with and (suspected) exposure to other hazardous metals: Secondary | ICD-10-CM

## 2017-05-12 DIAGNOSIS — M5412 Radiculopathy, cervical region: Secondary | ICD-10-CM

## 2017-05-12 DIAGNOSIS — G8929 Other chronic pain: Secondary | ICD-10-CM

## 2017-05-12 DIAGNOSIS — M5441 Lumbago with sciatica, right side: Secondary | ICD-10-CM

## 2017-06-20 ENCOUNTER — Ambulatory Visit
Admission: RE | Admit: 2017-06-20 | Discharge: 2017-06-20 | Disposition: A | Discharge status: Home / Self Care | Payer: No Typology Code available for payment source | Source: Ambulatory Visit | Attending: Student | Admitting: Student

## 2017-06-20 DIAGNOSIS — Z77018 Contact with and (suspected) exposure to other hazardous metals: Secondary | ICD-10-CM

## 2017-06-20 DIAGNOSIS — M5412 Radiculopathy, cervical region: Secondary | ICD-10-CM

## 2017-06-20 DIAGNOSIS — G8929 Other chronic pain: Secondary | ICD-10-CM

## 2017-06-20 DIAGNOSIS — M5442 Lumbago with sciatica, left side: Secondary | ICD-10-CM

## 2017-06-20 DIAGNOSIS — M5441 Lumbago with sciatica, right side: Secondary | ICD-10-CM

## 2017-06-20 MED ORDER — GADOBENATE DIMEGLUMINE 529 MG/ML IV SOLN
20.0000 mL | Freq: Once | INTRAVENOUS | Status: AC | PRN
  Administered 2017-06-20: 20 mL via INTRAVENOUS

## 2017-06-27 ENCOUNTER — Emergency Department (HOSPITAL_COMMUNITY)
Admission: EM | Admit: 2017-06-27 | Discharge: 2017-06-28 | Disposition: A | Discharge status: Home / Self Care | Payer: Medicaid Other | Attending: Emergency Medicine | Admitting: Emergency Medicine

## 2017-06-27 ENCOUNTER — Encounter (HOSPITAL_COMMUNITY): Payer: Self-pay | Admitting: Emergency Medicine

## 2017-06-27 ENCOUNTER — Other Ambulatory Visit: Payer: Self-pay

## 2017-06-27 DIAGNOSIS — Z79899 Other long term (current) drug therapy: Secondary | ICD-10-CM | POA: Insufficient documentation

## 2017-06-27 DIAGNOSIS — M79602 Pain in left arm: Secondary | ICD-10-CM | POA: Insufficient documentation

## 2017-06-27 DIAGNOSIS — I10 Essential (primary) hypertension: Secondary | ICD-10-CM | POA: Insufficient documentation

## 2017-06-27 DIAGNOSIS — F17200 Nicotine dependence, unspecified, uncomplicated: Secondary | ICD-10-CM | POA: Diagnosis not present

## 2017-06-27 DIAGNOSIS — M79601 Pain in right arm: Secondary | ICD-10-CM | POA: Diagnosis not present

## 2017-06-27 DIAGNOSIS — R202 Paresthesia of skin: Secondary | ICD-10-CM | POA: Diagnosis present

## 2017-06-27 LAB — BASIC METABOLIC PANEL
Anion gap: 11 (ref 5–15)
BUN: 10 mg/dL (ref 6–20)
CALCIUM: 9.3 mg/dL (ref 8.9–10.3)
CO2: 21 mmol/L — ABNORMAL LOW (ref 22–32)
CREATININE: 1.17 mg/dL (ref 0.61–1.24)
Chloride: 103 mmol/L (ref 101–111)
GFR calc Af Amer: >60 mL/min (ref >60)
GFR calc non Af Amer: >60 mL/min (ref >60)
GLUCOSE: 115 mg/dL — AB (ref 65–99)
POTASSIUM: 4 mmol/L (ref 3.5–5.1)
Sodium: 135 mmol/L (ref 135–145)

## 2017-06-27 LAB — CBC WITH DIFFERENTIAL/PLATELET
Basophils Absolute: 0 10*3/uL (ref 0.0–0.1)
Basophils Relative: 0 %
EOS PCT: 2 %
Eosinophils Absolute: 0.2 10*3/uL (ref 0.0–0.7)
HCT: 47.1 % (ref 39.0–52.0)
Hemoglobin: 16.5 g/dL (ref 13.0–17.0)
LYMPHS ABS: 1.6 10*3/uL (ref 0.7–4.0)
Lymphocytes Relative: 15 %
MCH: 31.6 pg (ref 26.0–34.0)
MCHC: 35 g/dL (ref 30.0–36.0)
MCV: 90.2 fL (ref 78.0–100.0)
MONO ABS: 0.7 10*3/uL (ref 0.1–1.0)
Monocytes Relative: 6 %
Neutro Abs: 8.5 10*3/uL — ABNORMAL HIGH (ref 1.7–7.7)
Neutrophils Relative %: 77 %
PLATELETS: 191 10*3/uL (ref 150–400)
RBC: 5.22 MIL/uL (ref 4.22–5.81)
RDW: 13 % (ref 11.5–15.5)
WBC: 11 10*3/uL — ABNORMAL HIGH (ref 4.0–10.5)

## 2017-06-27 MED ORDER — PREDNISONE 20 MG PO TABS
60.0000 mg | ORAL_TABLET | Freq: Once | ORAL | Status: AC
  Administered 2017-06-27: 60 mg via ORAL
  Filled 2017-06-27: qty 3

## 2017-06-27 MED ORDER — IBUPROFEN 200 MG PO TABS
600.0000 mg | ORAL_TABLET | Freq: Once | ORAL | Status: AC
  Administered 2017-06-27: 600 mg via ORAL
  Filled 2017-06-27: qty 1

## 2017-06-27 MED ORDER — DIAZEPAM 2 MG PO TABS
2.0000 mg | ORAL_TABLET | Freq: Once | ORAL | Status: AC
  Administered 2017-06-27: 2 mg via ORAL
  Filled 2017-06-27: qty 1

## 2017-06-27 NOTE — ED Notes (Signed)
Pt. Stated, they had a MRI last week, since the MRI the numbness has gotten worse.

## 2017-06-27 NOTE — ED Triage Notes (Signed)
Pt. Stated, I broke my back in 2009 and then I had a fusion and have had issues with cramping and numbness for the last week. Its all over I can barely walk.  Im having to use suppository to boop cause my stomach will hurt but I dont have the urge to boop. I can pee ok . If Im laying down its not as bad. I also take anxiety and BP for panic issues. Called Dr. Yetta BarreJones office and the said to come here.

## 2017-06-27 NOTE — ED Provider Notes (Signed)
[facility] EMERGENCY [facility] Provider Note   CSN: [id number] Arrival date & time: 06/27/17  1750     History   Chief Complaint Chief Complaint  Patient presents with  . Back Pain  . Weakness    HPI Chad Kane is a 37 y.o. male who presents with upper back pain and neck pain. PMH significant for chronic back pain s/p lumbar fusion in 2013 by Dr. Yetta BarreJones. He states that over the past several months he has had issues with numbness of the bilateral upper and lower extremities as well as diffuse muscle spasms. Over the past week he has had worsening numbness/tingling and spasms. He feels like his whole body spasms including his chest and abdomen. He reports he has had issues with constipation. He normally has several BM a day and today has had one which was very hard. He does not take narcotics because he doesn't like the way they make him feel. He has been taking Valium for spasms which has helped but his symptoms were so severe that he called Dr. Barnett ApplebaumJones's office today who advised him to come to the ED. He had a MRI of the cervical and lumbar spine last week which showed post-op changes with fusion of L4-L5 and L5-S1 without residual stenosis and mild segment disease at L3-L4 without significant stenosis of neural impingement. MRI of cervical spine showed multilevel severe DDD. [facility]  HPI  Past Medical History:  Diagnosis Date  . Anxiety   . Dysrhythmia    Unsure rhythm attributed to sleep apena  . Hypertension    [facility], [facility] one [facility]  . Sleep apnea    [facility]    There are no active problems to display for this patient.   Past Surgical History:  Procedure Laterality Date  . LUMBAR FUSION    . ORIF hand    . PARTIAL NEPHRECTOMY         Home Medications    Prior to Admission medications     Medication Sig Start Date End Date Taking? Authorizing Provider  busPIRone (BUSPAR) 10 MG tablet Take 10 mg by mouth 2 (two) times daily.   Yes [provider]  diazepam (VALIUM) 5 MG tablet Take 5 mg by mouth every 6 (six) hours as needed for anxiety.   Yes [provider]  lisinopril (PRINIVIL,ZESTRIL) 10 MG tablet Take 10 mg by mouth daily.   Yes [provider]  meloxicam (MOBIC) 7.5 MG tablet Take 7.5 mg by mouth 2 (two) times daily as needed. pain   Yes [provider]  traMADol (ULTRAM) 50 MG tablet Take 50 mg by mouth daily as needed. 06/01/17  Yes [provider]  HYDROcodone-acetaminophen (VICODIN) 5-500 MG per tablet Take 1-2 tablets by mouth every 8 (eight) hours as needed. pain    [provider]    Family History Family History  Problem Relation Age of Onset  . Anesthesia problems Neg Hx     Social History Social History   Tobacco Use  . Smoking status: Current Some Day Smoker  . Smokeless tobacco: Current User    Types: Chew, Snuff  Substance Use Topics  . Alcohol use: Not on file    Comment: 1 drink a drink  . Drug use: No     Allergies   Hctz [hydrochlorothiazide]  Review of Systems Review of Systems  Constitutional: Negative for fever.  Cardiovascular: Negative for chest pain.  Gastrointestinal: Positive for constipation. Negative for abdominal pain, nausea and vomiting.  Genitourinary: Negative for difficulty urinating.  Musculoskeletal: Positive for back pain, myalgias and neck pain. Negative for gait problem.  Neurological: Positive for numbness. Negative for weakness.  All other systems reviewed and are negative.    Physical Exam Updated Vital Signs BP 135/67 (BP Location: Left Arm)   Pulse 75   Temp 99.8 F (37.7 C) (Oral)   Resp 17   Ht 6\' 1"  (1.854 m)   Wt 104.3 kg (230 lb)   SpO2 97%   BMI 30.34 kg/m   Physical Exam  Constitutional: He is oriented to person, place, and time.  He appears well-developed and well-nourished. No distress.  Calm, cooperative  HENT:  Head: Normocephalic and atraumatic.  Eyes: Conjunctivae are normal. Pupils are equal, round, and reactive to light. Right eye exhibits no discharge. Left eye exhibits no discharge. No scleral icterus.  Neck: Normal range of motion.  Cardiovascular: Normal rate and regular rhythm. Exam reveals no gallop and no friction rub.  No murmur heard. Pulmonary/Chest: Effort normal and breath sounds normal. No stridor. No respiratory distress. He has no wheezes. He has no rales. He exhibits no tenderness.  Abdominal: Soft. Bowel sounds are normal. He exhibits no distension. There is no tenderness.  Musculoskeletal:  No obvious spasms. Prior lumbar surgical scar noted  5/5 upper and lower extremity strength Subjective tingling of the bilateral upper and lower extremities Ambulatory   Neurological: He is alert and oriented to person, place, and time.  Skin: Skin is warm and dry.  Psychiatric: He has a normal mood and affect. His behavior is normal.  Nursing note and vitals reviewed.    ED Treatments / Results  Labs (all labs ordered are listed, but only abnormal results are displayed) Labs Reviewed  CBC WITH DIFFERENTIAL/PLATELET - Abnormal; Notable for the following components:      Result Value   WBC 11.0 (*)    Neutro Abs 8.5 (*)    All other components within normal limits  BASIC METABOLIC PANEL - Abnormal; Notable for the following components:   CO2 21 (*)    Glucose, Bld 115 (*)    All other components within normal limits    EKG  EKG Interpretation None       Radiology No results found.  Procedures Procedures (including critical care time)  Medications Ordered in ED Medications  predniSONE (DELTASONE) tablet 60 mg (60 mg Oral Given 06/27/17 2338)  ibuprofen (ADVIL,MOTRIN) tablet 600 mg (600 mg Oral Given 06/27/17 2338)  diazepam (VALIUM) tablet 2 mg (2 mg Oral Given 06/27/17 2338)   oxyCODONE-acetaminophen (PERCOCET/ROXICET) 5-325 MG per tablet 1 tablet (1 tablet Oral Given 06/28/17 0230)     Initial Impression / Assessment and Plan / ED Course  I have reviewed the triage vital signs and the nursing notes.  Pertinent labs & imaging results that were available during my care of the patient were reviewed by me and considered in my medical decision making (see chart for details).  37 year old male presents with worsening numbness/tingling and spasms of the bilateral upper and lower extremities. He is not having difficulty having a BM although is able to urinate. Exam is unremarkable. He has normal strength and is ambulatory. His complaints are primarily subjective. Discussed with Dr. Preston Fleeting. Will obtain MRI of C-spine which is pending at shift change. Patient  care was transferred to S Upstill PA-C at shift change who will dispo accordingly  Final Clinical Impressions(s) / ED Diagnoses   Final diagnoses:  Paresthesia and pain of both upper extremities  Paresthesia of both lower extremities    ED Discharge Orders    None       Bethel Born, PA-C 06/28/17 1616    Nira Conn, MD 06/30/17 [phone]

## 2017-06-27 NOTE — ED Notes (Signed)
Patient educated about not driving or performing other critical tasks (such as operating heavy machinery, caring for infant/toddler/child) due to sedative nature of narcotic medications received while in the ED.  Pt/caregiver verbalized understanding.   

## 2017-06-28 ENCOUNTER — Emergency Department (HOSPITAL_COMMUNITY): Payer: Medicaid Other

## 2017-06-28 MED ORDER — OXYCODONE-ACETAMINOPHEN 5-325 MG PO TABS: 1.0000 | ORAL_TABLET | ORAL | 0 refills | Status: AC | PRN

## 2017-06-28 MED ORDER — OXYCODONE-ACETAMINOPHEN 5-325 MG PO TABS
1.0000 | ORAL_TABLET | Freq: Once | ORAL | Status: AC
  Administered 2017-06-28: 1 via ORAL
  Filled 2017-06-28: qty 1

## 2017-06-28 NOTE — ED Notes (Signed)
Went in to update patient on status at this time, pt sitting up eating a [employer] burger. I told him we are waiting on results, to refrain from eating at this time until results/reeval by provider.

## 2017-06-28 NOTE — ED Provider Notes (Signed)
Pt with chronic back pain, remote surgery to low back Upper back pain x 1 year with severe pain x 1 week Sees. Dr. Yetta BarreJones Bilateral arm and leg numbness, spastic muscles for months severe x several days MRI recently of upper and lower back - one week ago    Lumbar - fine   Cervical spine - multi-level disc disease. Non-focal neuro exam Here for further evaluation ?bowel retention No enuresis  MR c-spine pending Plan: anticipate d/ch home if MR is negative as expected. Has meds at home, does not want pain meds here.   MR is "stable since exam 06/20/17". Repeat neuro exam: equal strength in UE and LE, altered sensation that is also equal bilaterally. No cervical tenderness. Reflexes equal.  He can be discharged home to already scheduled follow up with Dr. Yetta BarreJones next Monday (07/03/17)   Elpidio AnisUpstill, Gomer France, PA-C 06/28/17 0211    Dione BoozeGlick, David, MD 06/28/17 [phone]

## 2017-06-28 NOTE — ED Notes (Signed)
Pt remains out of room in mri at this time

## 2017-06-28 NOTE — ED Notes (Signed)
Patient ambulatory to restroom down the hall with steady gait.

## 2017-06-28 NOTE — ED Notes (Signed)
Pt returned from MRI °

## 2017-07-03 ENCOUNTER — Other Ambulatory Visit: Payer: Self-pay | Admitting: Neurological Surgery

## 2017-07-19 NOTE — Pre-Procedure Instructions (Signed)
Chad Kane  07/19/2017      [id number] [place] [facility]- [place], [place] - [id number] [place] [address] Phone: [phone] Fax: [phone]    Your procedure is scheduled on Feb 14  [name]mitting at 800 A.M.  Call this number if you have problems the morning of surgery:  [phone]   Remember:  Do not eat food or drink liquids after midnight.  Take these medicines the morning of surgery with A SIP OF WATER Buspirone (Busbar), Diazepam (Valium) if needed, Loratadine (Claritin) if needed, Oxycodone (percocet) if needed  Stop taking aspirin, BC's, Goody's, Herbal medications, Fish Oil, Vitamins, Ibuprofen, Advil, Motrin, Aleve     Do not wear jewelry, make-up or nail polish.  Do not wear lotions, powders, or perfumes, or deodorant.  Do not shave 48 hours prior to surgery.  Men may shave face and neck.  Do not bring valuables to the hospital.  [facility] is not responsible for any belongings or valuables.  Contacts, dentures or bridgework may not be worn into surgery.  Leave your suitcase in the car.  After surgery it may be brought to your room.  For patients admitted to the hospital, discharge time will be determined by your treatment team.  Patients discharged the day of surgery will not be allowed to drive home.  Special instructions:  [facility] - Preparing for Surgery  Before surgery, you can play an important role.  Because skin is not sterile, your skin needs to be as free of germs as possible.  You can reduce the number of germs on you skin by washing with CHG (chlorahexidine gluconate) soap before surgery.  CHG is an antiseptic cleaner which kills germs and bonds with the skin to continue killing germs even after washing.  Please DO NOT use if you have an allergy to CHG or antibacterial soaps.  If your skin becomes reddened/irritated stop using the CHG and inform your nurse when you  arrive at Short Stay.  Do not shave (including legs and underarms) for at least 48 hours prior to the first CHG shower.  You may shave your face.  Please follow these instructions carefully:   1.  Shower with CHG Soap the night before surgery and the                                morning of Surgery.  2.  If you choose to wash your hair, wash your hair first as usual with your       normal shampoo.  3.  After you shampoo, rinse your hair and body thoroughly to remove the                      Shampoo.  4.  Use CHG as you would any other liquid soap.  You can apply chg directly       to the skin and wash gently with scrungie or a clean washcloth.  5.  Apply the CHG Soap to your body ONLY FROM THE NECK DOWN.        Do not use on open wounds or open sores.  Avoid contact with your eyes,       ears, mouth and genitals (private parts).  Wash genitals (private parts)       with your normal soap.  6.  Wash thoroughly, paying special attention to the area where your surgery        will be performed.  7.  Thoroughly rinse your body with warm water from the neck down.  8.  DO NOT shower/wash with your normal soap after using and rinsing off       the CHG Soap.  9.  Pat yourself dry with a clean towel.            10.  Wear clean pajamas.            11.  Place clean sheets on your bed the night of your first shower and do not        sleep with pets.  Day of Surgery  Do not apply any lotions/deoderants the morning of surgery.  Please wear clean clothes to the hospital/surgery center.     Please read over the following fact sheets that you were given. Pain Booklet, Coughing and Deep Breathing, MRSA Information and Surgical Site Infection Prevention

## 2017-07-20 ENCOUNTER — Encounter (HOSPITAL_COMMUNITY): Payer: Self-pay | Admitting: *Deleted

## 2017-07-20 ENCOUNTER — Other Ambulatory Visit: Payer: Self-pay

## 2017-07-20 ENCOUNTER — Encounter (HOSPITAL_COMMUNITY)
Admission: RE | Admit: 2017-07-20 | Discharge: 2017-07-20 | Disposition: A | Discharge status: Home / Self Care | Payer: Medicaid Other | Source: Ambulatory Visit | Attending: Neurological Surgery | Admitting: Neurological Surgery

## 2017-07-20 DIAGNOSIS — Z01812 Encounter for preprocedural laboratory examination: Secondary | ICD-10-CM | POA: Insufficient documentation

## 2017-07-20 LAB — BASIC METABOLIC PANEL
Anion gap: 9 (ref 5–15)
BUN: 11 mg/dL (ref 6–20)
CALCIUM: 9.3 mg/dL (ref 8.9–10.3)
CHLORIDE: 101 mmol/L (ref 101–111)
CO2: 25 mmol/L (ref 22–32)
CREATININE: 1.23 mg/dL (ref 0.61–1.24)
GFR calc Af Amer: >60 mL/min (ref >60)
Glucose, Bld: 98 mg/dL (ref 65–99)
Potassium: 4.4 mmol/L (ref 3.5–5.1)
SODIUM: 135 mmol/L (ref 135–145)

## 2017-07-20 LAB — CBC
HCT: 46 % (ref 39.0–52.0)
Hemoglobin: 15.7 g/dL (ref 13.0–17.0)
MCH: 31.6 pg (ref 26.0–34.0)
MCHC: 34.1 g/dL (ref 30.0–36.0)
MCV: 92.6 fL (ref 78.0–100.0)
PLATELETS: 147 10*3/uL — AB (ref 150–400)
RBC: 4.97 MIL/uL (ref 4.22–5.81)
RDW: 13.9 % (ref 11.5–15.5)
WBC: 7 10*3/uL (ref 4.0–10.5)

## 2017-07-20 LAB — TYPE AND SCREEN
ABO/RH(D): A POS
ANTIBODY SCREEN: NEGATIVE

## 2017-07-20 LAB — SURGICAL PCR SCREEN
MRSA, PCR: NEGATIVE
Staphylococcus aureus: NEGATIVE

## 2017-07-20 NOTE — Progress Notes (Signed)
PCP is Dr Earlie Serverhallie Minton States he saw a Cardiologist about 4 years ago due to sleep apnea. Dr [place]. States he had a stress test that was normal-request sent for stress test results. Denis ever having a card cath or echo. Denies chest pain, fever, but reports a clear productive cough at times. Has a cpap -doesn't wear.

## 2017-07-21 NOTE — Progress Notes (Addendum)
Anesthesia Chart Review:  Pt is a 37 year old male scheduled for C3-4, C4-5, C5-6 ACDF on 07/27/2017 with Marikay Alaravid Jones, MD.   - PCP is Earlie Serverhall63ie Minton, MD - Saw cardiologist Leverne Humblesomas Vybiral, MD in 2012 for c/p chest pain. Last office visit 11/17/10; echo done, no f/u recommended.   PMH includes:  HTN, OSA. Current smoker. BMI 29.5. S/p lumbar fusion 07/07/11  Medications include: lisinopril  BP 117/73   Pulse 73   Temp 36.6 C   Resp 20   Ht 6\' 1"  (1.854 m)   Wt 223 lb 6.4 oz (101.3 kg)   SpO2 98%   BMI 29.47 kg/m   Preoperative labs reviewed.    EKG 07/20/17: NSR. Increased R/S ratio in V1, consider early transition or posterior infarct  Echo 11/17/10 [facility] in [place], [place]  1. Normal LV size and systolic function. LVH and diastolic dysfunction 2. MVP, mild MR.  3. Mild TR, mild PR  If no changes, I anticipate pt can proceed with surgery as scheduled.   Rica Mastngela Krysten Veronica, FNP-BC [facility]iology Phone: [phone] 07/21/2017 5:01 PM

## 2017-07-27 ENCOUNTER — Encounter (HOSPITAL_COMMUNITY): Payer: Self-pay | Admitting: *Deleted

## 2017-07-27 ENCOUNTER — Ambulatory Visit (HOSPITAL_COMMUNITY): Payer: Medicaid Other

## 2017-07-27 ENCOUNTER — Other Ambulatory Visit: Payer: Self-pay

## 2017-07-27 ENCOUNTER — Ambulatory Visit (HOSPITAL_COMMUNITY)
Admission: RE | Disposition: A | Discharge status: Home / Self Care | Payer: Self-pay | Source: Ambulatory Visit | Attending: Neurological Surgery

## 2017-07-27 ENCOUNTER — Ambulatory Visit (HOSPITAL_COMMUNITY): Payer: Medicaid Other | Admitting: Emergency Medicine

## 2017-07-27 ENCOUNTER — Observation Stay (HOSPITAL_COMMUNITY)
Admission: RE | Admit: 2017-07-27 | Discharge: 2017-07-28 | Disposition: A | Discharge status: Home / Self Care | Payer: Medicaid Other | Source: Ambulatory Visit | Attending: Neurological Surgery | Admitting: Neurological Surgery

## 2017-07-27 DIAGNOSIS — M4802 Spinal stenosis, cervical region: Secondary | ICD-10-CM | POA: Insufficient documentation

## 2017-07-27 DIAGNOSIS — F17210 Nicotine dependence, cigarettes, uncomplicated: Secondary | ICD-10-CM | POA: Insufficient documentation

## 2017-07-27 DIAGNOSIS — I10 Essential (primary) hypertension: Secondary | ICD-10-CM | POA: Diagnosis not present

## 2017-07-27 DIAGNOSIS — F419 Anxiety disorder, unspecified: Secondary | ICD-10-CM | POA: Diagnosis not present

## 2017-07-27 DIAGNOSIS — Z79899 Other long term (current) drug therapy: Secondary | ICD-10-CM | POA: Diagnosis not present

## 2017-07-27 DIAGNOSIS — Z9989 Dependence on other enabling machines and devices: Secondary | ICD-10-CM | POA: Insufficient documentation

## 2017-07-27 DIAGNOSIS — M47892 Other spondylosis, cervical region: Secondary | ICD-10-CM | POA: Diagnosis not present

## 2017-07-27 DIAGNOSIS — M50222 Other cervical disc displacement at C5-C6 level: Secondary | ICD-10-CM | POA: Insufficient documentation

## 2017-07-27 DIAGNOSIS — M47812 Spondylosis without myelopathy or radiculopathy, cervical region: Secondary | ICD-10-CM | POA: Insufficient documentation

## 2017-07-27 DIAGNOSIS — Z419 Encounter for procedure for purposes other than remedying health state, unspecified: Secondary | ICD-10-CM

## 2017-07-27 DIAGNOSIS — M4800 Spinal stenosis, site unspecified: Secondary | ICD-10-CM | POA: Diagnosis present

## 2017-07-27 DIAGNOSIS — M4322 Fusion of spine, cervical region: Secondary | ICD-10-CM | POA: Diagnosis present

## 2017-07-27 DIAGNOSIS — Z981 Arthrodesis status: Secondary | ICD-10-CM | POA: Diagnosis not present

## 2017-07-27 DIAGNOSIS — Z880 Allergy status to penicillin: Secondary | ICD-10-CM | POA: Diagnosis not present

## 2017-07-27 DIAGNOSIS — G4730 Sleep apnea, unspecified: Secondary | ICD-10-CM | POA: Insufficient documentation

## 2017-07-27 HISTORY — DX: Headache, unspecified: R51.9

## 2017-07-27 HISTORY — DX: Unspecified osteoarthritis, unspecified site: M19.90

## 2017-07-27 HISTORY — PX: ANTERIOR CERVICAL DECOMP/DISCECTOMY FUSION: SHX1161

## 2017-07-27 HISTORY — DX: Headache: R51

## 2017-07-27 SURGERY — ANTERIOR CERVICAL DECOMPRESSION/DISCECTOMY FUSION 3 LEVELS
Anesthesia: General | Site: Spine Cervical

## 2017-07-27 MED ORDER — BUPIVACAINE HCL (PF) 0.25 % IJ SOLN
INTRAMUSCULAR | Status: AC
  Filled 2017-07-27: qty 30

## 2017-07-27 MED ORDER — OXYCODONE-ACETAMINOPHEN 5-325 MG PO TABS
1.0000 | ORAL_TABLET | ORAL | Status: DC | PRN
Start: 2017-07-27 — End: 2017-07-27
  Administered 2017-07-27: 1 via ORAL
  Filled 2017-07-27: qty 1

## 2017-07-27 MED ORDER — MIDAZOLAM HCL 5 MG/5ML IJ SOLN
INTRAMUSCULAR | Status: DC | PRN
  Administered 2017-07-27: 2 mg via INTRAVENOUS

## 2017-07-27 MED ORDER — OXYCODONE HCL 5 MG PO TABS
ORAL_TABLET | ORAL | Status: AC
  Filled 2017-07-27: qty 1

## 2017-07-27 MED ORDER — METHOCARBAMOL 500 MG PO TABS: 500.0000 mg | ORAL_TABLET | Freq: Four times a day (QID) | ORAL | Status: DC | PRN

## 2017-07-27 MED ORDER — CEFAZOLIN SODIUM-DEXTROSE 2-3 GM-%(50ML) IV SOLR
INTRAVENOUS | Status: DC | PRN
  Administered 2017-07-27: 2 g via INTRAVENOUS

## 2017-07-27 MED ORDER — ROCURONIUM BROMIDE 100 MG/10ML IV SOLN
INTRAVENOUS | Status: DC | PRN
  Administered 2017-07-27: 50 mg via INTRAVENOUS
  Administered 2017-07-27: 10 mg via INTRAVENOUS

## 2017-07-27 MED ORDER — ACETAMINOPHEN 325 MG PO TABS: 650.0000 mg | ORAL_TABLET | ORAL | Status: DC | PRN

## 2017-07-27 MED ORDER — ONDANSETRON HCL 4 MG/2ML IJ SOLN
INTRAMUSCULAR | Status: AC
  Filled 2017-07-27: qty 2

## 2017-07-27 MED ORDER — ONDANSETRON HCL 4 MG/2ML IJ SOLN: 4.0000 mg | Freq: Four times a day (QID) | INTRAMUSCULAR | Status: DC | PRN

## 2017-07-27 MED ORDER — OXYCODONE HCL 5 MG/5ML PO SOLN: 5.0000 mg | Freq: Once | ORAL | Status: AC | PRN

## 2017-07-27 MED ORDER — DEXAMETHASONE SODIUM PHOSPHATE 10 MG/ML IJ SOLN
INTRAMUSCULAR | Status: DC | PRN
  Administered 2017-07-27: 10 mg via INTRAVENOUS

## 2017-07-27 MED ORDER — CYCLOBENZAPRINE HCL 10 MG PO TABS
10.0000 mg | ORAL_TABLET | Freq: Three times a day (TID) | ORAL | Status: DC | PRN
  Administered 2017-07-27 – 2017-07-28 (×2): 10 mg via ORAL
  Filled 2017-07-27 (×2): qty 1

## 2017-07-27 MED ORDER — HYDROMORPHONE HCL 1 MG/ML IJ SOLN
0.5000 mg | INTRAMUSCULAR | Status: DC | PRN
Start: 2017-07-27 — End: 2017-07-28
  Administered 2017-07-27 – 2017-07-28 (×2): 0.5 mg via INTRAVENOUS
  Filled 2017-07-27 (×2): qty 0.5

## 2017-07-27 MED ORDER — FENTANYL CITRATE (PF) 100 MCG/2ML IJ SOLN
INTRAMUSCULAR | Status: DC | PRN
  Administered 2017-07-27 (×2): 50 ug via INTRAVENOUS
  Administered 2017-07-27 (×2): 100 ug via INTRAVENOUS
  Administered 2017-07-27: 50 ug via INTRAVENOUS

## 2017-07-27 MED ORDER — PROMETHAZINE HCL 25 MG/ML IJ SOLN: 6.2500 mg | INTRAMUSCULAR | Status: DC | PRN

## 2017-07-27 MED ORDER — FENTANYL CITRATE (PF) 100 MCG/2ML IJ SOLN: 25.0000 ug | INTRAMUSCULAR | Status: DC | PRN

## 2017-07-27 MED ORDER — PROPOFOL 10 MG/ML IV BOLUS
INTRAVENOUS | Status: AC
  Filled 2017-07-27: qty 20

## 2017-07-27 MED ORDER — THROMBIN 5000 UNITS EX SOLR
CUTANEOUS | Status: AC
  Filled 2017-07-27: qty 5000

## 2017-07-27 MED ORDER — SENNA 8.6 MG PO TABS
1.0000 | ORAL_TABLET | Freq: Two times a day (BID) | ORAL | Status: DC
  Administered 2017-07-27 – 2017-07-28 (×2): 8.6 mg via ORAL
  Filled 2017-07-27 (×2): qty 1

## 2017-07-27 MED ORDER — ARTIFICIAL TEARS OPHTHALMIC OINT
TOPICAL_OINTMENT | OPHTHALMIC | Status: DC | PRN
  Administered 2017-07-27: 1 via OPHTHALMIC

## 2017-07-27 MED ORDER — CEFAZOLIN SODIUM-DEXTROSE 2-4 GM/100ML-% IV SOLN
2.0000 g | Freq: Three times a day (TID) | INTRAVENOUS | Status: AC
  Administered 2017-07-27 – 2017-07-28 (×2): 2 g via INTRAVENOUS
  Filled 2017-07-27 (×2): qty 100

## 2017-07-27 MED ORDER — 0.9 % SODIUM CHLORIDE (POUR BTL) OPTIME
TOPICAL | Status: DC | PRN
  Administered 2017-07-27: 1000 mL

## 2017-07-27 MED ORDER — MIDAZOLAM HCL 2 MG/2ML IJ SOLN
INTRAMUSCULAR | Status: AC
  Filled 2017-07-27: qty 2

## 2017-07-27 MED ORDER — BUSPIRONE HCL 10 MG PO TABS
10.0000 mg | ORAL_TABLET | Freq: Two times a day (BID) | ORAL | Status: DC
  Administered 2017-07-27 – 2017-07-28 (×2): 10 mg via ORAL
  Filled 2017-07-27 (×2): qty 1

## 2017-07-27 MED ORDER — NEOSTIGMINE METHYLSULFATE 10 MG/10ML IV SOLN
INTRAVENOUS | Status: DC | PRN
  Administered 2017-07-27: 3 mg via INTRAVENOUS

## 2017-07-27 MED ORDER — PROPOFOL 10 MG/ML IV BOLUS
INTRAVENOUS | Status: DC | PRN
  Administered 2017-07-27: 200 mg via INTRAVENOUS

## 2017-07-27 MED ORDER — FENTANYL CITRATE (PF) 250 MCG/5ML IJ SOLN
INTRAMUSCULAR | Status: AC
  Filled 2017-07-27: qty 5

## 2017-07-27 MED ORDER — CEFAZOLIN SODIUM-DEXTROSE 2-4 GM/100ML-% IV SOLN
INTRAVENOUS | Status: AC
  Filled 2017-07-27: qty 100

## 2017-07-27 MED ORDER — ROCURONIUM BROMIDE 10 MG/ML (PF) SYRINGE
PREFILLED_SYRINGE | INTRAVENOUS | Status: AC
  Filled 2017-07-27: qty 5

## 2017-07-27 MED ORDER — LACTATED RINGERS IV SOLN
INTRAVENOUS | Status: DC
  Administered 2017-07-27 (×2): via INTRAVENOUS

## 2017-07-27 MED ORDER — NEOSTIGMINE METHYLSULFATE 5 MG/5ML IV SOSY
PREFILLED_SYRINGE | INTRAVENOUS | Status: AC
  Filled 2017-07-27: qty 5

## 2017-07-27 MED ORDER — ONDANSETRON HCL 4 MG/2ML IJ SOLN
INTRAMUSCULAR | Status: DC | PRN
  Administered 2017-07-27: 4 mg via INTRAVENOUS

## 2017-07-27 MED ORDER — THROMBIN (RECOMBINANT) 5000 UNITS EX SOLR
OROMUCOSAL | Status: DC | PRN
  Administered 2017-07-27 (×2): 5 mL via TOPICAL

## 2017-07-27 MED ORDER — DEXAMETHASONE SODIUM PHOSPHATE 10 MG/ML IJ SOLN
INTRAMUSCULAR | Status: AC
  Filled 2017-07-27: qty 1

## 2017-07-27 MED ORDER — OXYCODONE-ACETAMINOPHEN 5-325 MG PO TABS
1.0000 | ORAL_TABLET | ORAL | Status: DC | PRN
  Administered 2017-07-27 – 2017-07-28 (×3): 2 via ORAL
  Filled 2017-07-27 (×3): qty 2

## 2017-07-27 MED ORDER — SODIUM CHLORIDE 0.9 % IV SOLN: 250.0000 mL | INTRAVENOUS | Status: DC

## 2017-07-27 MED ORDER — PHENOL 1.4 % MT LIQD: 1.0000 | OROMUCOSAL | Status: DC | PRN

## 2017-07-27 MED ORDER — CLONAZEPAM 0.5 MG PO TABS
0.5000 mg | ORAL_TABLET | Freq: Three times a day (TID) | ORAL | Status: DC | PRN
  Administered 2017-07-28: 0.5 mg via ORAL
  Filled 2017-07-27: qty 1

## 2017-07-27 MED ORDER — HEMOSTATIC AGENTS (NO CHARGE) OPTIME
TOPICAL | Status: DC | PRN
  Administered 2017-07-27: 1 via TOPICAL

## 2017-07-27 MED ORDER — SODIUM CHLORIDE 0.9% FLUSH: 3.0000 mL | INTRAVENOUS | Status: DC | PRN

## 2017-07-27 MED ORDER — SODIUM CHLORIDE 0.9% FLUSH
3.0000 mL | Freq: Two times a day (BID) | INTRAVENOUS | Status: DC
  Administered 2017-07-27: 3 mL via INTRAVENOUS

## 2017-07-27 MED ORDER — METHOCARBAMOL 500 MG PO TABS
500.0000 mg | ORAL_TABLET | Freq: Four times a day (QID) | ORAL | Status: DC | PRN
  Administered 2017-07-28: 500 mg via ORAL
  Filled 2017-07-27: qty 1

## 2017-07-27 MED ORDER — BUPIVACAINE HCL (PF) 0.25 % IJ SOLN
INTRAMUSCULAR | Status: DC | PRN
  Administered 2017-07-27: 5 mL

## 2017-07-27 MED ORDER — LIDOCAINE 2% (20 MG/ML) 5 ML SYRINGE
INTRAMUSCULAR | Status: AC
  Filled 2017-07-27: qty 5

## 2017-07-27 MED ORDER — METHOCARBAMOL 1000 MG/10ML IJ SOLN
500.0000 mg | Freq: Four times a day (QID) | INTRAMUSCULAR | Status: DC | PRN
  Administered 2017-07-28: 500 mg via INTRAVENOUS
  Filled 2017-07-27: qty 5

## 2017-07-27 MED ORDER — OXYCODONE HCL 5 MG PO TABS
5.0000 mg | ORAL_TABLET | Freq: Once | ORAL | Status: AC | PRN
  Administered 2017-07-27: 5 mg via ORAL

## 2017-07-27 MED ORDER — METHOCARBAMOL 1000 MG/10ML IJ SOLN: 500.0000 mg | Freq: Four times a day (QID) | INTRAVENOUS | Status: DC | PRN

## 2017-07-27 MED ORDER — BACITRACIN 50000 UNITS IM SOLR
INTRAMUSCULAR | Status: DC | PRN
  Administered 2017-07-27: 500 mL

## 2017-07-27 MED ORDER — ACETAMINOPHEN 650 MG RE SUPP: 650.0000 mg | RECTAL | Status: DC | PRN

## 2017-07-27 MED ORDER — LIDOCAINE HCL (CARDIAC) 20 MG/ML IV SOLN
INTRAVENOUS | Status: DC | PRN
  Administered 2017-07-27: 100 mg via INTRAVENOUS

## 2017-07-27 MED ORDER — MENTHOL 3 MG MT LOZG: 1.0000 | LOZENGE | OROMUCOSAL | Status: DC | PRN

## 2017-07-27 MED ORDER — GLYCOPYRROLATE 0.2 MG/ML IJ SOLN
INTRAMUSCULAR | Status: DC | PRN
  Administered 2017-07-27: 0.4 mg via INTRAVENOUS

## 2017-07-27 MED ORDER — POTASSIUM CHLORIDE IN NACL 20-0.9 MEQ/L-% IV SOLN: INTRAVENOUS | Status: DC

## 2017-07-27 MED ORDER — LISINOPRIL 10 MG PO TABS
10.0000 mg | ORAL_TABLET | Freq: Every day | ORAL | Status: DC
  Administered 2017-07-27: 10 mg via ORAL
  Filled 2017-07-27: qty 1

## 2017-07-27 MED ORDER — CYCLOBENZAPRINE HCL 10 MG PO TABS
ORAL_TABLET | ORAL | Status: AC
  Filled 2017-07-27: qty 1

## 2017-07-27 MED ORDER — ONDANSETRON HCL 4 MG PO TABS: 4.0000 mg | ORAL_TABLET | Freq: Four times a day (QID) | ORAL | Status: DC | PRN

## 2017-07-27 SURGICAL SUPPLY — 59 items
BAG DECANTER FOR FLEXI CONT (MISCELLANEOUS) ×3 IMPLANT
BASKET BONE COLLECTION (BASKET) ×3 IMPLANT
BENZOIN TINCTURE PRP APPL 2/3 (GAUZE/BANDAGES/DRESSINGS) ×3 IMPLANT
BIT DRILL 13 (BIT) ×2 IMPLANT
BIT DRILL 13MM (BIT) ×1
BUR MATCHSTICK NEURO 3.0 LAGG (BURR) ×3 IMPLANT
CANISTER SUCT 3000ML PPV (MISCELLANEOUS) ×3 IMPLANT
CARTRIDGE OIL MAESTRO DRILL (MISCELLANEOUS) ×1 IMPLANT
CLOSURE WOUND 1/2 X4 (GAUZE/BANDAGES/DRESSINGS) ×1
DIFFUSER DRILL AIR PNEUMATIC (MISCELLANEOUS) ×3 IMPLANT
DRAIN SNY WOU 7FLT (WOUND CARE) ×3 IMPLANT
DRAPE C-ARM 42X72 X-RAY (DRAPES) ×6 IMPLANT
DRAPE LAPAROTOMY 100X72 PEDS (DRAPES) ×3 IMPLANT
DRAPE MICROSCOPE LEICA (MISCELLANEOUS) ×3 IMPLANT
DRAPE POUCH INSTRU U-SHP 10X18 (DRAPES) ×3 IMPLANT
DRSG OPSITE POSTOP 3X4 (GAUZE/BANDAGES/DRESSINGS) ×3 IMPLANT
DURAPREP 6ML APPLICATOR 50/CS (WOUND CARE) ×3 IMPLANT
ELECT COATED BLADE 2.86 ST (ELECTRODE) ×3 IMPLANT
ELECT REM PT RETURN 9FT ADLT (ELECTROSURGICAL) ×3
ELECTRODE REM PT RTRN 9FT ADLT (ELECTROSURGICAL) ×1 IMPLANT
EVACUATOR SILICONE 100CC (DRAIN) ×3 IMPLANT
GAUZE SPONGE 4X4 16PLY XRAY LF (GAUZE/BANDAGES/DRESSINGS) IMPLANT
GLOVE BIO SURGEON STRL SZ7 (GLOVE) ×3 IMPLANT
GLOVE BIO SURGEON STRL SZ8 (GLOVE) ×3 IMPLANT
GLOVE BIOGEL PI IND STRL 6.5 (GLOVE) ×2 IMPLANT
GLOVE BIOGEL PI IND STRL 7.0 (GLOVE) ×1 IMPLANT
GLOVE BIOGEL PI INDICATOR 6.5 (GLOVE) ×4
GLOVE BIOGEL PI INDICATOR 7.0 (GLOVE) ×2
GLOVE SURG SS PI 6.0 STRL IVOR (GLOVE) ×3 IMPLANT
GLOVE SURG SS PI 6.5 STRL IVOR (GLOVE) ×12 IMPLANT
GOWN STRL REUS W/ TWL LRG LVL3 (GOWN DISPOSABLE) ×5 IMPLANT
GOWN STRL REUS W/ TWL XL LVL3 (GOWN DISPOSABLE) ×2 IMPLANT
GOWN STRL REUS W/TWL 2XL LVL3 (GOWN DISPOSABLE) IMPLANT
GOWN STRL REUS W/TWL LRG LVL3 (GOWN DISPOSABLE) ×10
GOWN STRL REUS W/TWL XL LVL3 (GOWN DISPOSABLE) ×4
HEMOSTAT POWDER KIT SURGIFOAM (HEMOSTASIS) ×3 IMPLANT
KIT BASIN OR (CUSTOM PROCEDURE TRAY) ×3 IMPLANT
KIT ROOM TURNOVER OR (KITS) ×3 IMPLANT
NEEDLE HYPO 25X1 1.5 SAFETY (NEEDLE) ×3 IMPLANT
NEEDLE SPNL 20GX3.5 QUINCKE YW (NEEDLE) ×3 IMPLANT
NS IRRIG 1000ML POUR BTL (IV SOLUTION) ×3 IMPLANT
OIL CARTRIDGE MAESTRO DRILL (MISCELLANEOUS) ×3
PACK LAMINECTOMY NEURO (CUSTOM PROCEDURE TRAY) ×3 IMPLANT
PAD ARMBOARD 7.5X6 YLW CONV (MISCELLANEOUS) ×12 IMPLANT
PIN DISTRACTION 14MM (PIN) ×6 IMPLANT
PLATE 3 57.5XLCK NS SPNE CVD (Plate) ×1 IMPLANT
PLATE 3 ATLANTIS TRANS (Plate) ×2 IMPLANT
RUBBERBAND STERILE (MISCELLANEOUS) ×6 IMPLANT
SCREW 4.0X14 (Screw) ×6 IMPLANT
SCREW ST 14X4XST FXANG SPNE (Screw) ×6 IMPLANT
SCREW ST FIX 4 ATL (Screw) ×12 IMPLANT
SPACER PTI-C 7X16X14 IDENTI (Spacer) ×9 IMPLANT
SPONGE INTESTINAL PEANUT (DISPOSABLE) ×3 IMPLANT
SPONGE SURGIFOAM ABS GEL 100 (HEMOSTASIS) ×3 IMPLANT
STRIP CLOSURE SKIN 1/2X4 (GAUZE/BANDAGES/DRESSINGS) ×2 IMPLANT
SUT VIC AB 3-0 SH 8-18 (SUTURE) ×6 IMPLANT
TOWEL GREEN STERILE (TOWEL DISPOSABLE) ×3 IMPLANT
TOWEL GREEN STERILE FF (TOWEL DISPOSABLE) ×3 IMPLANT
WATER STERILE IRR 1000ML POUR (IV SOLUTION) ×3 IMPLANT

## 2017-07-27 NOTE — Op Note (Signed)
07/27/2017  1:54 PM  PATIENT:  Chad Kane  37 y.o. 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 Decompressive anterior cervical discectomy C3-4 C4-5 C5-6, 2. Anterior cervical arthrodesis C3-4 C4-5 C5-6 utilizing a porous titanium interbody cage packed with locally harvested morcellized autologous bone graft, 3. Anterior cervical plating C3-C6 inclusive utilizing a Atlantis translational plate  SURGEON:  Marikay Alaravid Anasofia Micallef, MD  ASSISTANTS: Meyran FNP  ANESTHESIA:   General  EBL: 450 ml  Total I/O In: 1000 [I.V.:1000] Out: 450 [Blood:450]  BLOOD ADMINISTERED: none  DRAINS: none  SPECIMEN:  none  INDICATION FOR PROCEDURE: This patient presented with severe neck and right arm pain. Imaging showed spondylosis C3-4 C4-5 and C5-6 with severe right-sided foraminal stenosis. The patient tried conservative measures without relief. Pain was debilitating. Recommended ACDF with plating. Patient understood the risks, benefits, and alternatives and potential outcomes and wished to proceed.  PROCEDURE DETAILS: Patient was brought to the operating room placed under general endotracheal anesthesia. Patient was placed in the supine position on the operating room table. The neck was prepped with Duraprep and draped in a sterile fashion.   Three cc of local anesthesia was injected and a transverse incision was made on the right side of the neck.  Dissection was carried down thru the subcutaneous tissue and the platysma was  elevated, opened, and undermined with Metzenbaum scissors.  Dissection was then carried out thru an avascular plane leaving the sternocleidomastoid carotid artery and jugular vein laterally and the trachea and esophagus medially. The ventral aspect of the vertebral column was identified and a localizing x-ray was taken. The C5-6 level was  identified. The longus colli muscles were then elevated and the retractor was placed to expose C3-4 C4-5 and C5-6. The annulus was incised and the disc space entered at each level. Discectomy was performed with micro-curettes and pituitary rongeurs. I then used the high-speed drill to drill the endplates down to the level of the posterior longitudinal ligament. The drill shavings were saved in a mucous trap for later arthrodesis. The operating microscope was draped and brought into the field provided additional magnification, illumination and visualization. Discectomy was continued posteriorly thru the disc space. Posterior longitudinal ligament was opened with a nerve hook, and then removed along with disc herniation and osteophytes, decompressing the spinal canal and thecal sac. We then continued to remove osteophytic overgrowth and disc material decompressing the neural foramina and exiting nerve roots bilaterally. Particular attention was placed on the right-hand side where he had severe foraminal stenosis. The scope was angled up and down to help decompress and undercut the vertebral bodies. Once the decompression was completed we could pass a nerve hook circumferentially to assure adequate decompression in the midline and in the neural foramina. So by both visualization and palpation we felt we had an adequate decompression of the neural elements. We then measured the height of the intravertebral disc space and selected a 7 millimeter PTi interbody cage packed with autograft. It was then gently positioned in the intravertebral disc space(s) and countersunk. I then used a Atlantis translational plate and placed 14 mm fixed angle screws into the vertebral bodies of each level and locked them into position. The wound was irrigated with bacitracin solution, checked for hemostasis which was established and confirmed. Once meticulous hemostasis was achieved, I placed a 7 flat JP drain and we then proceeded with  closure. The platysma was closed with interrupted 3-0 undyed Vicryl suture, the subcuticular layer was closed with interrupted 3-0 undyed Vicryl suture. The skin edges were approximated with steristrips. The drapes were removed. A sterile dressing was applied. The patient was then awakened from general anesthesia and transferred to the recovery room in stable condition. At the end of the procedure all sponge, needle and instrument counts were correct.   PLAN OF CARE: Admit for overnight observation  PATIENT DISPOSITION:  PACU - hemodynamically stable.   Delay start of Pharmacological VTE agent (>24hrs) due to surgical blood loss or risk of bleeding:  yes

## 2017-07-27 NOTE — Anesthesia Postprocedure Evaluation (Signed)
Anesthesia Post Note  Patient: Chad Kane  Procedure(s) Performed: ANTERIOR CERVICAL DECOMPRESSION FUSION - CERVICAL THREE- CERVICAL FOUR - CERVICAL FOUR-CERVICAL FIVE - CERVICAL FIVE-CERVICAL SIX (N/A Spine Cervical)     Patient location during evaluation: PACU Anesthesia Type: General Level of consciousness: awake and alert Pain management: pain level controlled Vital Signs Assessment: post-procedure vital signs reviewed and stable Respiratory status: spontaneous breathing, nonlabored ventilation and respiratory function stable Cardiovascular status: blood pressure returned to baseline and stable Postop Assessment: no apparent nausea or vomiting Anesthetic complications: no    Last Vitals:  Vitals:   07/27/17 1415 07/27/17 1451  BP: 134/88 136/83  Pulse: 87 79  Resp: 11 16  Temp:  36.7 C  SpO2: 95% 96%    Last Pain:  Vitals:   07/27/17 1413  TempSrc:   PainSc: 4                  Beryle Lathehomas E Brock

## 2017-07-27 NOTE — Transfer of Care (Signed)
Immediate Anesthesia Transfer of Care Note  Patient: Chad Kane  Procedure(s) Performed: ANTERIOR CERVICAL DECOMPRESSION FUSION - CERVICAL THREE- CERVICAL FOUR - CERVICAL FOUR-CERVICAL FIVE - CERVICAL FIVE-CERVICAL SIX (N/A Spine Cervical)  Patient Location: PACU  Anesthesia Type:General  Level of Consciousness: awake, alert , oriented and patient cooperative  Airway & Oxygen Therapy: Patient Spontanous Breathing and Patient connected to nasal cannula oxygen  Post-op Assessment: Report given to RN and Post -op Vital signs reviewed and stable  Post vital signs: Reviewed and stable  Last Vitals:  Vitals:   07/27/17 0810  BP: 116/74  Pulse: 83  Resp: 18  Temp: 36.7 C  SpO2: 97%    Last Pain:  Vitals:   07/27/17 0810  TempSrc: Oral  PainSc:       Patients Stated Pain Goal: 2 (07/20/17 0910)  Complications: No apparent anesthesia complications

## 2017-07-27 NOTE — Anesthesia Preprocedure Evaluation (Addendum)
Anesthesia Evaluation  Patient identified by MRN, date of birth, ID band Patient awake    Reviewed: Allergy & Precautions, H&P , NPO status , Patient's Chart, lab work & pertinent test results  History of Anesthesia Complications Negative for: history of anesthetic complications  Airway Mallampati: I   Neck ROM: Full    Dental  (+) Teeth Intact, Dental Advisory Given, Chipped,    Pulmonary sleep apnea , Current Smoker,    breath sounds clear to auscultation       Cardiovascular hypertension, Pt. on medications  Rhythm:Regular Rate:Normal     Neuro/Psych  Headaches, Anxiety    GI/Hepatic negative GI ROS, Neg liver ROS,   Endo/Other  negative endocrine ROS  Renal/GU negative Renal ROS     Musculoskeletal  (+) Arthritis ,   Abdominal   Peds  Hematology negative hematology ROS (+)   Anesthesia Other Findings   Reproductive/Obstetrics                            Anesthesia Physical  Anesthesia Plan  ASA: II  Anesthesia Plan: General   Post-op Pain Management:    Induction: Intravenous  PONV Risk Score and Plan: 2 and Ondansetron, Dexamethasone, Treatment may vary due to age or medical condition and Midazolam  Airway Management Planned: Oral ETT  Additional Equipment: None  Intra-op Plan:   Post-operative Plan: Extubation in OR  Informed Consent: I have reviewed the patients History and Physical, chart, labs and discussed the procedure including the risks, benefits and alternatives for the proposed anesthesia with the patient or authorized representative who has indicated his/her understanding and acceptance.   Dental advisory given  Plan Discussed with: CRNA  Anesthesia Plan Comments:         Anesthesia Quick Evaluation

## 2017-07-27 NOTE — H&P (Signed)
Subjective:   Patient is a 37 y.o. male admitted for acdf. The patient first presented to me with complaints of neck pain and arm pain. Onset of symptoms was several months ago. The pain is described as aching, sharp, dull, stabbing and cramping and occurs all day. The pain is rated intense, and is located in the neck and radiates to the R arm. The symptoms have been progressive. Symptoms are exacerbated by extending head backwards, and are relieved by none.  Previous work up includes MRI of cervical spine, results: spinal stenosis.  Past Medical History:  Diagnosis Date  . Anxiety   . Arthritis   . Headache   . Hypertension    Echo done at [facility], Also seen a cardiologist at [facility] for one visit  . Sleep apnea    Uses CPAP sleep study 6-8 month at [facility] Copy requested    Past Surgical History:  Procedure Laterality Date  . LUMBAR FUSION    . ORIF hand    . PARTIAL NEPHRECTOMY      Allergies  Allergen Reactions  . Hctz [Hydrochlorothiazide] Other (See Comments)    SYNCOPE [? FROM HYPOTENSION ?] HYPOTENSION    Social History   Tobacco Use  . Smoking status: Current Some Day Smoker    Packs/day: 0.25    Types: Cigarettes  . Smokeless tobacco: Former Neurosurgeon    Types: Snuff, Chew  Substance Use Topics  . Alcohol use: Not on file    Comment: social    Family History  Problem Relation Age of Onset  . Heart attack Father   . Anesthesia problems Neg Hx    Prior to Admission medications   Medication Sig Start Date End Date Taking? Authorizing Provider  busPIRone (BUSPAR) 10 MG tablet Take 10 mg by mouth 2 (two) times daily.   Yes [provider]  clonazePAM (KLONOPIN) 0.5 MG tablet Take 0.5 mg by mouth 3 (three) times daily as needed for anxiety.   Yes [provider]  cyclobenzaprine (FLEXERIL) 10 MG tablet Take 10 mg by mouth 3 (three) times daily as needed for muscle spasms.    Yes [provider]  diazepam (VALIUM)  5 MG tablet Take 5 mg by mouth every 4 (four) hours as needed for muscle spasms.    Yes [provider]  lisinopril (PRINIVIL,ZESTRIL) 10 MG tablet Take 10 mg by mouth at bedtime.    Yes [provider]  meloxicam (MOBIC) 7.5 MG tablet Take 7.5 mg by mouth 2 (two) times daily as needed. pain   Yes [provider]  oxyCODONE-acetaminophen (PERCOCET/ROXICET) 5-325 MG tablet Take 1 tablet by mouth every 4 (four) hours as needed for severe pain. 06/28/17  Yes Upstill, Melvenia Beam, PA-C  ibuprofen (ADVIL,MOTRIN) 200 MG tablet Take 400 mg by mouth every 8 (eight) hours as needed for headache or mild pain.    [provider]  loratadine (CLARITIN) 10 MG tablet Take 10 mg by mouth daily as needed for allergies.    [provider]     Review of Systems  Positive ROS: neg  All other systems have been reviewed and were otherwise negative with the exception of those mentioned in the HPI and as above.  Objective: Vital signs in last 24 hours: Temp:  [98 F (36.7 C)] 98 F (36.7 C) (02/14 0810) Pulse Rate:  [83] 83 (02/14 0810) Resp:  [18] 18 (02/14 0810) BP: (116)/(74) 116/74 (02/14 0810) SpO2:  [97 %] 97 % (  02/14 0810) Weight:  [101.3 kg (223 lb 6.4 oz)] 101.3 kg (223 lb 6.4 oz) (02/14 0803)  General Appearance: Alert, cooperative, no distress, appears stated age Head: Normocephalic, without obvious abnormality, atraumatic Eyes: PERRL, conjunctiva/corneas clear, EOM's intact      Neck: Supple, symmetrical, trachea midline, Back: Symmetric, no curvature, ROM normal, no CVA tenderness Lungs:  respirations unlabored Heart: Regular rate and rhythm Abdomen: Soft, non-tender Extremities: Extremities normal, atraumatic, no cyanosis or edema Pulses: 2+ and symmetric all extremities Skin: Skin color, texture, turgor normal, no rashes or lesions  NEUROLOGIC:  Mental status: Alert and oriented x4, no aphasia, good attention span, fund of knowledge and memory   Motor Exam - grossly normal Sensory Exam - grossly normal Reflexes: 1+ Coordination - grossly normal Gait - grossly normal Balance - grossly normal Cranial Nerves: I: smell Not tested  II: visual acuity  OS: nl    OD: nl  II: visual fields Full to confrontation  II: pupils Equal, round, reactive to light  III,VII: ptosis None  III,IV,VI: extraocular muscles  Full ROM  V: mastication Normal  V: facial light touch sensation  Normal  V,VII: corneal reflex  Present  VII: facial muscle function - upper  Normal  VII: facial muscle function - lower Normal  VIII: hearing Not tested  IX: soft palate elevation  Normal  IX,X: gag reflex Present  XI: trapezius strength  5/5  XI: sternocleidomastoid strength 5/5  XI: neck flexion strength  5/5  XII: tongue strength  Normal    Data Review Lab Results  Component Value Date   WBC 7.0 07/20/2017   HGB 15.7 07/20/2017   HCT 46.0 07/20/2017   MCV 92.6 07/20/2017   PLT 147 (L) 07/20/2017   Lab Results  Component Value Date   NA 135 07/20/2017   K 4.4 07/20/2017   CL 101 07/20/2017   CO2 25 07/20/2017   BUN 11 07/20/2017   CREATININE 1.23 07/20/2017   GLUCOSE 98 07/20/2017   Lab Results  Component Value Date   INR 0.95 04/30/2010    Assessment:   Cervical neck pain with herniated nucleus pulposus/ spondylosis/ stenosis at C3-4 C4-5 C5-6. Estimated body mass index is 29.47 kg/m as calculated from the following:   Height as of this encounter: 6\' 1"  (1.854 m).   Weight as of this encounter: 101.3 kg (223 lb 6.4 oz).  Patient has failed conservative therapy. Planned surgery : ACDF C3-4 C4-5 C5-6  Plan:   I explained the condition and procedure to the patient and answered any questions.  Patient wishes to proceed with procedure as planned. Understands risks/ benefits/ and expected or typical outcomes.  Eldean Nanna S 07/27/2017 10:32 AM

## 2017-07-28 DIAGNOSIS — M47892 Other spondylosis, cervical region: Secondary | ICD-10-CM | POA: Diagnosis not present

## 2017-07-28 MED ORDER — OXYCODONE-ACETAMINOPHEN 5-325 MG PO TABS: 1.0000 | ORAL_TABLET | ORAL | 0 refills | Status: AC | PRN

## 2017-07-28 MED ORDER — METHOCARBAMOL 500 MG PO TABS: 500.0000 mg | ORAL_TABLET | Freq: Four times a day (QID) | ORAL | 0 refills | Status: AC | PRN

## 2017-07-28 MED FILL — Thrombin For Soln 5000 Unit: CUTANEOUS | Qty: 5000 | Status: AC

## 2017-07-28 NOTE — Progress Notes (Signed)
Patient is discharged from room 3C11 at this time. Alert and in stable condition. IV site d/c'd and instructions read to patient and family with understanding verbalized. Left unit via wheelchair with all belongings at side. 

## 2017-07-28 NOTE — Discharge Summary (Signed)
Physician Discharge Summary  Patient ID: Chad Kane MRN: [id number] DOB/AGE: Oct 31, 1980 37 y.o.  Admit date: 07/27/2017 Discharge date: 07/28/2017  Admission Diagnoses: Cervical spondylosis C3-4 C4-5 C5-6 with right-sided neural foraminal stenosis, neck and right arm pain  Discharge Diagnoses: same   Discharged Condition: good  Hospital Course: The patient was admitted on 07/27/2017 and taken to the operating room where the patient underwent acdf C3-4, 4-5, 5-6. The patient tolerated the procedure well and was taken to the recovery room and then to the floor in stable condition. The hospital course was routine. There were no complications. The wound remained clean dry and intact. Pt had appropriate neck soreness. No complaints of new pain or new N/T/W. The patient remained afebrile with stable vital signs, and tolerated a regular diet. The patient continued to increase activities, and pain was well controlled with oral pain medications.   Consults: None  Significant Diagnostic Studies:  Results for orders placed or performed during the hospital encounter of 07/27/17  Surgical pcr screen  Result Value Ref Range   MRSA, PCR NEGATIVE NEGATIVE   Staphylococcus aureus NEGATIVE NEGATIVE  Basic metabolic panel  Result Value Ref Range   Sodium 135 135 - 145 mmol/L   Potassium 4.4 3.5 - 5.1 mmol/L   Chloride 101 101 - 111 mmol/L   CO2 25 22 - 32 mmol/L   Glucose, Bld 98 65 - 99 mg/dL   BUN 11 6 - 20 mg/dL   Creatinine, Ser 4.09 0.61 - 1.24 mg/dL   Calcium 9.3 8.9 - 81.1 mg/dL   GFR calc non Af Amer >60 >60 mL/min   GFR calc Af Amer >60 >60 mL/min   Anion gap 9 5 - 15  CBC  Result Value Ref Range   WBC 7.0 4.0 - 10.5 K/uL   RBC 4.97 4.22 - 5.81 MIL/uL   Hemoglobin 15.7 13.0 - 17.0 g/dL   HCT 91.4 78.2 - 95.6 %   MCV 92.6 78.0 - 100.0 fL   MCH 31.6 26.0 - 34.0 pg   MCHC 34.1 30.0 - 36.0 g/dL   RDW 21.3 08.6 - 57.8 %   Platelets 147 (L) 150 - 400 K/uL  Type and screen All  Cardiac and thoracic surgeries, spinal fusions, myomectomies, craniotomies, colon & liver resections, total joint revisions, same day c-section with placenta previa or accreta.  Result Value Ref Range   ABO/RH(D) A POS    Antibody Screen NEG    Sample Expiration 08/03/2017    Extend sample reason      NO TRANSFUSIONS OR PREGNANCY IN THE PAST 3 MONTHS Performed at [facility], [address] [place], [place] [address]     Dg Cervical Spine 2-3 Views  Result Date: 07/27/2017 CLINICAL DATA:  Cervical fusion. EXAM: CERVICAL SPINE - 2-3 VIEW; DG C-ARM 61-120 MIN COMPARISON:  MRI cervical spine 06/28/2017 FINDINGS: Well positioned anterior plate and screw and interbody fusion devices at C3-4, C4-5 and C5-6. No complicating features. IMPRESSION: Anterior and interbody fusion hardware at C3-4, C4-5 and C5-6. Electronically Signed   By: Rudie Meyer M.D.   On: 07/27/2017 13:47   Dg C-arm 1-60 Min  Result Date: 07/27/2017 CLINICAL DATA:  Cervical fusion. EXAM: CERVICAL SPINE - 2-3 VIEW; DG C-ARM 61-120 MIN COMPARISON:  MRI cervical spine 06/28/2017 FINDINGS: Well positioned anterior plate and screw and interbody fusion devices at C3-4, C4-5 and C5-6. No complicating features. IMPRESSION: Anterior and interbody fusion hardware at C3-4, C4-5 and C5-6. Electronically Signed  By: Rudie Meyer M.D.   On: 07/27/2017 13:47    Antibiotics:  Anti-infectives (From admission, onward)   Start     Dose/Rate Route Frequency Ordered Stop   07/27/17 2000  ceFAZolin (ANCEF) IVPB 2g/100 mL premix     2 g 200 mL/hr over 30 Minutes Intravenous Every 8 hours 07/27/17 1446 07/28/17 0337   07/27/17 1133  bacitracin 50,000 Units in sodium chloride irrigation 0.9 % 500 mL irrigation  Status:  Discontinued       As needed 07/27/17 1133 07/27/17 1356   07/27/17 1035  ceFAZolin (ANCEF) 2-4 GM/100ML-% IVPB    Comments:  Vinnie Langton   : cabinet override      07/27/17 1035 07/27/17 2244      Discharge  Exam: Blood pressure 113/69, pulse 86, temperature 98.5 F (36.9 C), temperature source Oral, resp. rate 18, height 6\' 1"  (1.854 m), weight 101.3 kg (223 lb 6.4 oz), SpO2 97 %. Neurologic: Grossly normal Ambulating and voiding well  Discharge Medications:   Allergies as of 07/28/2017      Reactions   Hctz [hydrochlorothiazide] Other (See Comments)   SYNCOPE [? FROM HYPOTENSION ?] HYPOTENSION      Medication List    TAKE these medications   busPIRone 10 MG tablet Commonly known as:  BUSPAR Take 10 mg by mouth 2 (two) times daily.   clonazePAM 0.5 MG tablet Commonly known as:  KLONOPIN Take 0.5 mg by mouth 3 (three) times daily as needed for anxiety.   cyclobenzaprine 10 MG tablet Commonly known as:  FLEXERIL Take 10 mg by mouth 3 (three) times daily as needed for muscle spasms.   diazepam 5 MG tablet Commonly known as:  VALIUM Take 5 mg by mouth every 4 (four) hours as needed for muscle spasms.   ibuprofen 200 MG tablet Commonly known as:  ADVIL,MOTRIN Take 400 mg by mouth every 8 (eight) hours as needed for headache or mild pain.   lisinopril 10 MG tablet Commonly known as:  PRINIVIL,ZESTRIL Take 10 mg by mouth at bedtime.   loratadine 10 MG tablet Commonly known as:  CLARITIN Take 10 mg by mouth daily as needed for allergies.   meloxicam 7.5 MG tablet Commonly known as:  MOBIC Take 7.5 mg by mouth 2 (two) times daily as needed. pain   methocarbamol 500 MG tablet Commonly known as:  ROBAXIN Take 1 tablet (500 mg total) by mouth every 6 (six) hours as needed for muscle spasms.   oxyCODONE-acetaminophen 5-325 MG tablet Commonly known as:  PERCOCET/ROXICET Take 1 tablet by mouth every 4 (four) hours as needed for severe pain. What changed:  Another medication with the same name was added. Make sure you understand how and when to take each.   oxyCODONE-acetaminophen 5-325 MG tablet Commonly known as:  PERCOCET/ROXICET Take 1-2 tablets by mouth every 4 (four)  hours as needed for severe pain. What changed:  You were already taking a medication with the same name, and this prescription was added. Make sure you understand how and when to take each.       Disposition: home   Final Dx: same  Discharge Instructions     Remove dressing in 72 hours   Complete by:  As directed    Call MD for:  difficulty breathing, headache or visual disturbances   Complete by:  As directed    Call MD for:  extreme fatigue   Complete by:  As directed    Call MD for:  hives   Complete by:  As directed    Call MD for:  persistant dizziness or light-headedness   Complete by:  As directed    Call MD for:  persistant nausea and vomiting   Complete by:  As directed    Call MD for:  redness, tenderness, or signs of infection (pain, swelling, redness, odor or green/yellow discharge around incision site)   Complete by:  As directed    Call MD for:  severe uncontrolled pain   Complete by:  As directed    Call MD for:  temperature >100.4   Complete by:  As directed    Diet - low sodium heart healthy   Complete by:  As directed    Driving Restrictions   Complete by:  As directed    No driving 2 weeks   Increase activity slowly   Complete by:  As directed    Lifting restrictions   Complete by:  As directed    Nothing heavier than 8 lbs         Signed: Tiana LoftKimberly Hannah Darryl Blumenstein 07/28/2017, 7:38 AM

## 2017-07-31 ENCOUNTER — Encounter (HOSPITAL_COMMUNITY): Payer: Self-pay | Admitting: Neurological Surgery

## 2018-01-22 ENCOUNTER — Ambulatory Visit: Payer: Medicaid Other | Attending: Neurological Surgery

## 2018-01-22 ENCOUNTER — Other Ambulatory Visit: Payer: Self-pay

## 2018-01-22 DIAGNOSIS — R252 Cramp and spasm: Secondary | ICD-10-CM | POA: Diagnosis present

## 2018-01-22 DIAGNOSIS — M542 Cervicalgia: Secondary | ICD-10-CM | POA: Diagnosis not present

## 2018-01-22 DIAGNOSIS — R293 Abnormal posture: Secondary | ICD-10-CM | POA: Diagnosis present

## 2018-01-22 NOTE — Patient Instructions (Signed)
Reviewed use of tennis balls for sTW as he was doing a bottle for this at home . He has been stretching so  Reviewed current stretching and suggested  4 finger chin from chest for stretching

## 2018-01-22 NOTE — Therapy (Addendum)
[facility] [address] [place], [place], [address] Phone: [phone]   Fax:  [phone]  Physical Therapy Evaluation  Patient Details  Name: Chad Kane MRN: [id number] Date of Birth: 03/23/1981 Referring Provider: Marikay Alaravid Jones, MD   Encounter Date: 01/22/2018  PT End of Session - 01/22/18 1144    Visit Number  1    Number of Visits  12    Date for PT Re-Evaluation  03/02/18    Authorization Type  MCD    PT Start Time  1145    PT Stop Time  1222    PT Time Calculation (min)  37 min    Activity Tolerance  Patient tolerated treatment well    Behavior During Therapy  [facility] for tasks assessed/performed       Past Medical History:  Diagnosis Date  . Anxiety   . Arthritis   . Headache   . Hypertension    [facility], [facility] one [facility]  . Sleep apnea    [facility]    Past Surgical History:  Procedure Laterality Date  . ANTERIOR CERVICAL DECOMP/DISCECTOMY FUSION N/A 07/27/2017   Procedure: ANTERIOR CERVICAL DECOMPRESSION FUSION - CERVICAL THREE- CERVICAL FOUR - CERVICAL FOUR-CERVICAL FIVE - CERVICAL FIVE-CERVICAL SIX;  Surgeon: Tia AlertJones, David S, MD;  [facility];  Service: Neurosurgery;  Laterality: N/A;  ANTERIOR  . LUMBAR FUSION    . ORIF hand    . PARTIAL NEPHRECTOMY      There were no vitals filed for this visit.   Subjective Assessment - 01/22/18 1151    Subjective  Cervical fusion 07/2017. He continues with LT trap pain.   MD suggested dry needling .   Pain runs into back of head with headaches.     Pertinent History  Lumbar fusion L5 S1.  second sx removal of hardware lumbar,   Next surgery for thoracic spine.   He report 2009 fall down imbankment with fracture to spine.     Limitations  Lifting;House hold activities   turning head for traffic   Patient Stated Goals  He wants to decrease pain     Currently in Pain?  Yes    Pain Score  3     Pain Location  Neck    Pain Orientation  Left    Pain Descriptors / Indicators  Sharp    Pain Type  Chronic pain    Pain Radiating Towards  RT arm was numb bepore SX now RT thumb worse numbness    Pain Onset  More than a month ago    Pain Frequency  Intermittent    Aggravating Factors   turning of head    Pain Relieving Factors  medication , heat     Multiple Pain Sites  Yes    Pain Score  5    Pain Location  Back    Pain Orientation  Lower    Pain Descriptors / Indicators  Aching    Pain Type  Chronic pain    Pain Onset  More than a month ago    Pain Frequency  Constant                    Objective measurements completed on examination: See above findings.              PT Education - 01/22/18 1144  Education Details  POC , Home stretching , use of tennis ball  dry needle info    Person(s) Educated  Patient    Methods  Explanation;Demonstration;Handout    Comprehension  Verbalized understanding;Returned demonstration       PT Short Term Goals - 01/22/18 1142      PT SHORT TERM GOAL #1   Title  He will be independent with initial HEP    Baseline  No program    Time  2    Period  Weeks    Status  New      PT SHORT TERM GOAL #2   Title  He will report pain decr 20% or more in neck    Baseline  Pain  3/10 at eval    Time  2    Period  Weeks    Status  New      PT SHORT TERM GOAL #3   Title  LT  neck rotation incr to 45 degrees    Baseline  at eval neck  35    Time  2    Period  Weeks    Status  New        PT Long Term Goals - 01/22/18 1226      PT LONG TERM GOAL #1   Title  He will be independent with all HEp issued    Baseline  independent with initial HEP    Time  6    Period  Weeks    Status  New      PT LONG TERM GOAL #2   Title  He will report neck pain decr 50% or more    Baseline  neck pain decr 20% or more    Time  6    Period  Weeks    Status  New      PT LONG TERM  GOAL #3   Title  He will be able to turn head RT and LT with 50% less pain.     Baseline  Pain varie but can be mod severe with turning head    Time  6    Period  Weeks    Status  New      PT LONG TERM GOAL #4   Title  He will report a 50% decr in intendity or duration of headaches    Baseline  varied with headache    Time  6    Period  Weeks    Status  New             Plan - 01/22/18 1220    Clinical Impression Statement  Mr Jaci StandardLyon presents with chronic neck and trap pain the headaches that are fairly constant but he can have no pain at time.s He is limited with turning head and sidebending. He has forward head posture.  Marland Kitchen. He may have ongoing ROM limits but shouuld have decr pain/spasms with skilled PT.     History and Personal Factors relevant to plan of care:  Previous back surgeries x 3 from a fall     Clinical Presentation  Unstable    Clinical Presentation due to:  ongoing neck pain and headaches post cervical fusion    Clinical Decision Making  Moderate    Rehab Potential  Good    PT Frequency  --   3 visits    PT Duration  --   over 2 weeks then 2x/week for 4 weeks if needed   PT Treatment/Interventions  Passive  range of motion;Manual techniques;Taping;Patient/family education;Therapeutic exercise;Moist Heat;Dry needling;Cryotherapy    PT Next Visit Plan  STW to neck , modalities , DN if able    PT Home Exercise Plan  ROM reviewed for home, STW with tennis balls    Consulted and Agree with Plan of Care  Patient       Patient will benefit from skilled therapeutic intervention in order to improve the following deficits and impairments:  Pain, Postural dysfunction, Increased muscle spasms, Decreased range of motion, Decreased activity tolerance  Visit Diagnosis: Cervicalgia - Plan: PT plan of care cert/re-cert  Abnormal posture - Plan: PT plan of care cert/re-cert  Cramp and spasm - Plan: PT plan of care cert/re-cert     Problem List Patient Active Problem List    Diagnosis Date Noted  . Cervical vertebral fusion 07/27/2017    Caprice Red,  PT 01/23/2018, 5:45 PM  [facility] [address] [place], [place], [address] Phone: [phone]   Fax:  [phone]  Name: TAVAUGHN SILGUERO MRN: [id number] Date of Birth: 01-Mar-1981

## 2018-01-29 ENCOUNTER — Ambulatory Visit: Payer: Medicaid Other | Admitting: Physical Therapy

## 2018-02-09 ENCOUNTER — Ambulatory Visit: Payer: Medicaid Other | Admitting: Physical Therapy

## 2018-02-15 ENCOUNTER — Ambulatory Visit: Payer: Medicaid Other

## 2018-11-05 IMAGING — MR MR CERVICAL SPINE W/O CM
4 of 6 series · 18 of 48 positions shown · non-contrast
Comparison: 06/20/2017 cervical spine MRI.

CLINICAL DATA: 36 y/o M; neck and back pain with bilateral arm and
leg pain, weakness, and numbness.

EXAM:
MRI CERVICAL SPINE WITHOUT CONTRAST
TECHNIQUE: Multiplanar, multisequence MR imaging of the cervical spine was
performed. No intravenous contrast was administered.

[Series 3: T2 · sagittal · 3.0mm · 0.43mm/px · 5 of 18 slices shown (1 of 3)]
[im 1/18]
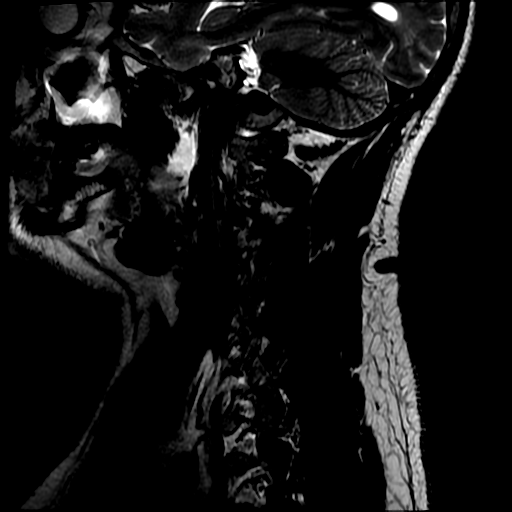
[im 5/18]
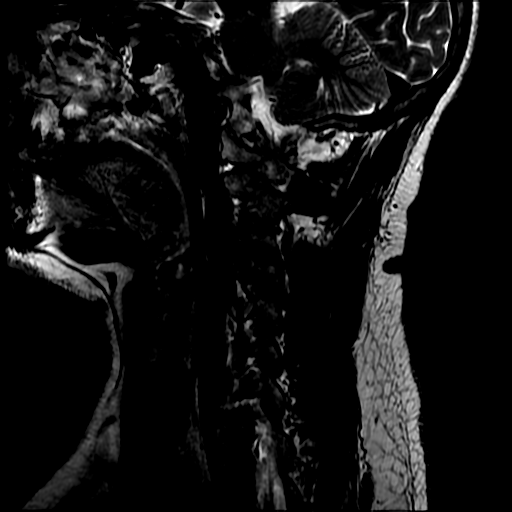
[im 9/18]
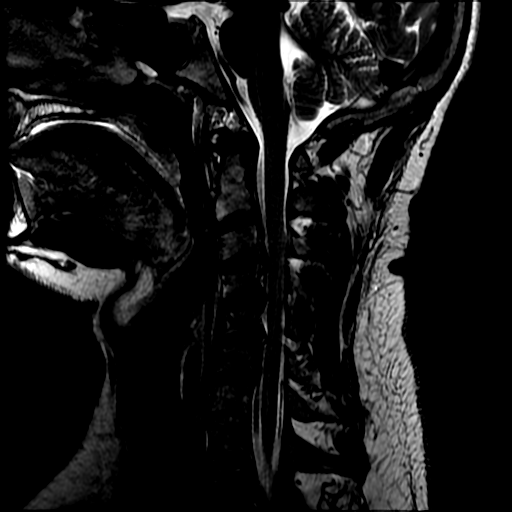
[im 13/18]
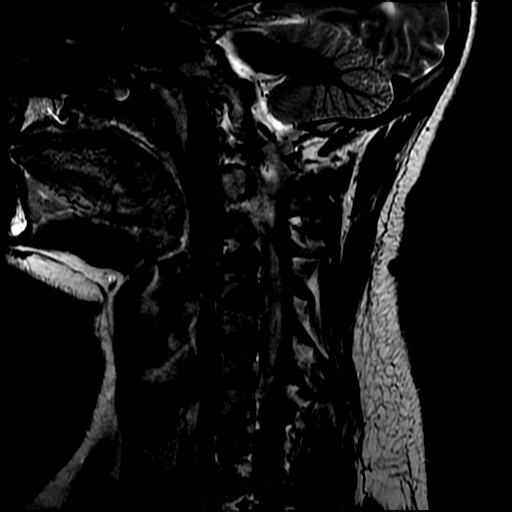
[im 18/18]
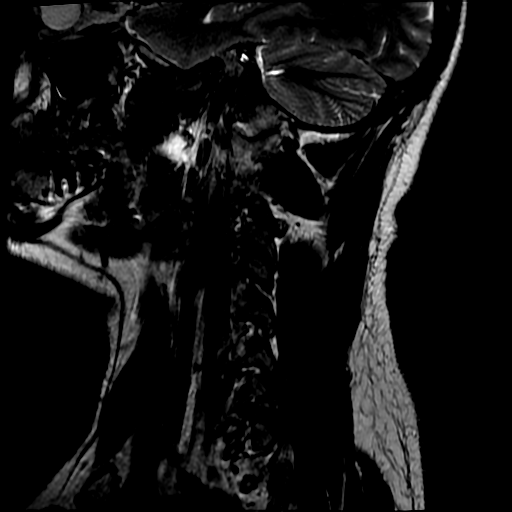

[Series 5: STIR · sagittal · 3.0mm · 0.43mm/px · 3 of 18 slices shown]
[im 4/18]
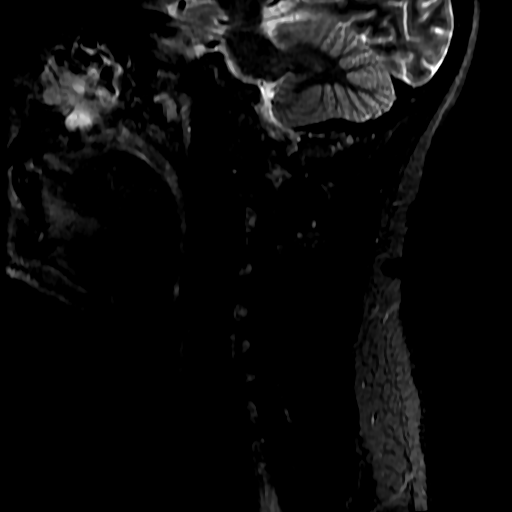
[im 11/18]
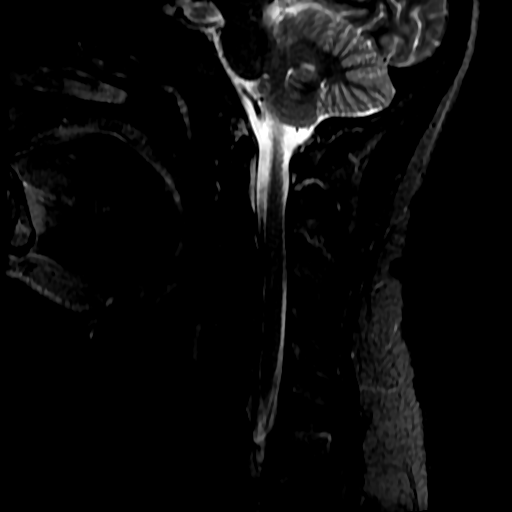
[im 18/18]
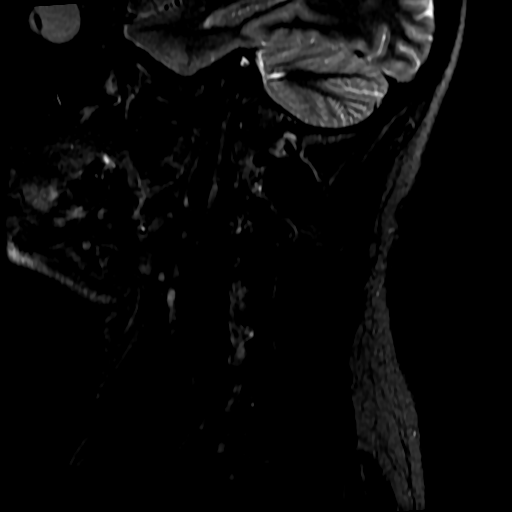

[Series 6: T2 · axial · 3.0mm · 0.35mm/px · z∈[-82,-2]mm · 7 of 29 slices shown (2 of 3)]
[im 1/29]
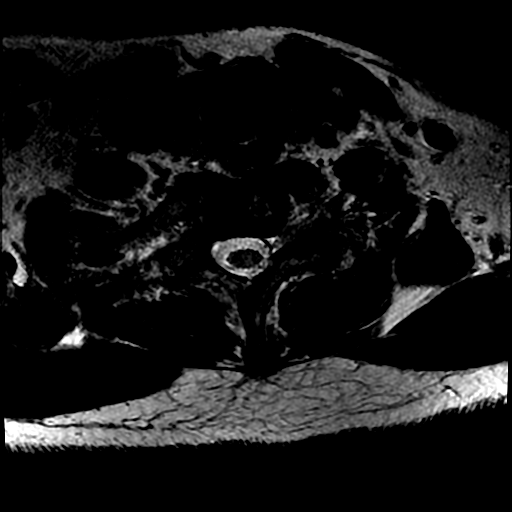
[im 4/29]
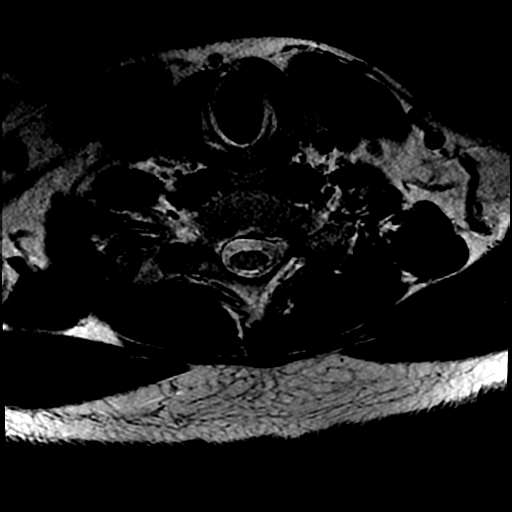
[im 8/29]
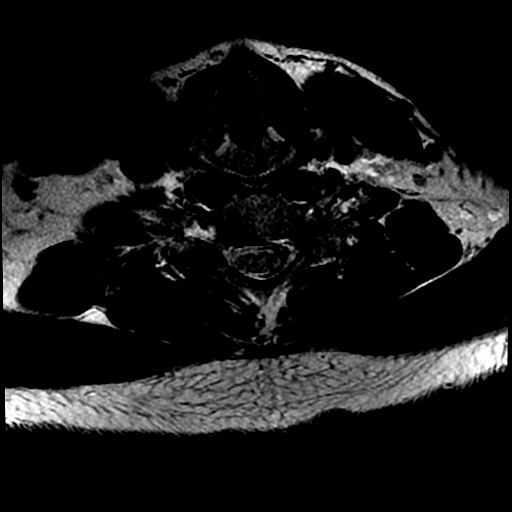
[im 11/29]
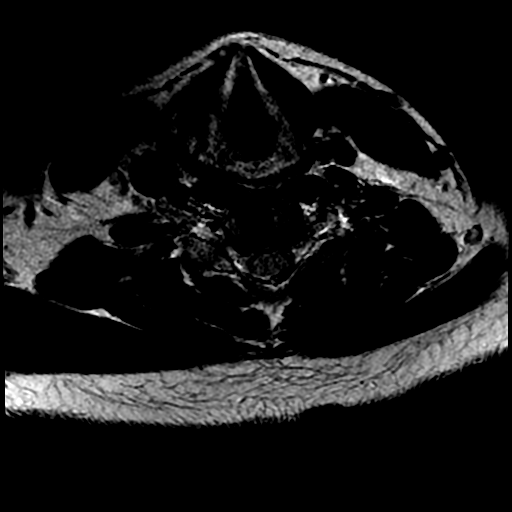
[im 15/29]
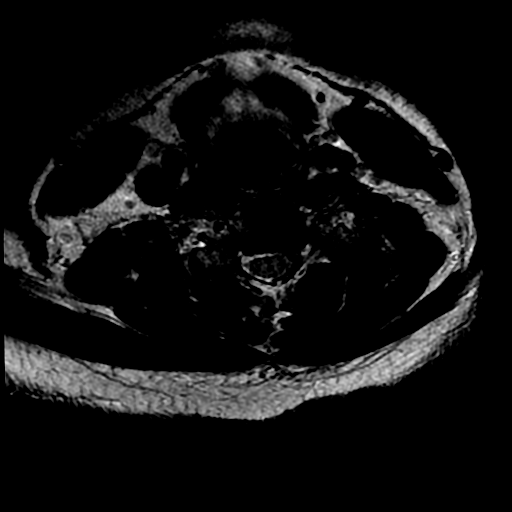
[im 18/29]
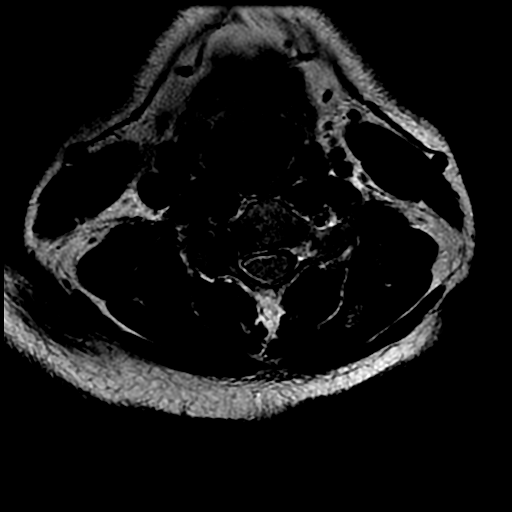
[im 25/29]
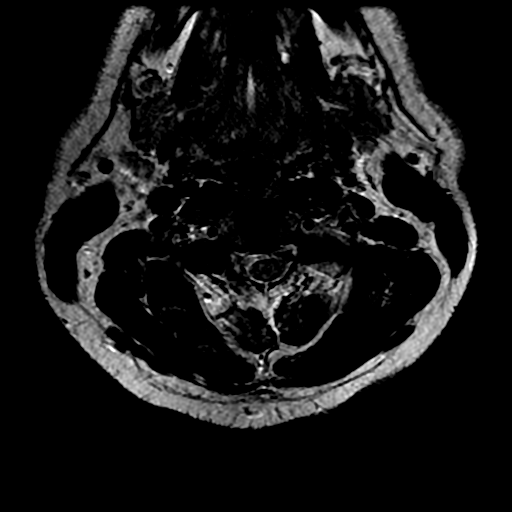

[Series 8: T2 · axial · 3.0mm · 0.35mm/px · z∈[-74,+0]mm · 3 of 34 slices shown (3 of 3)]
[im 4/34]
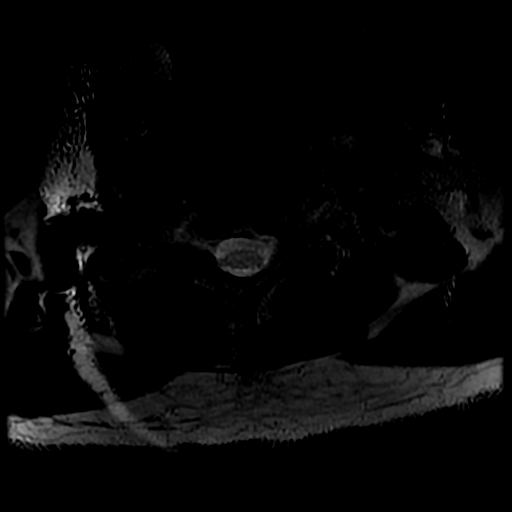
[im 17/34]
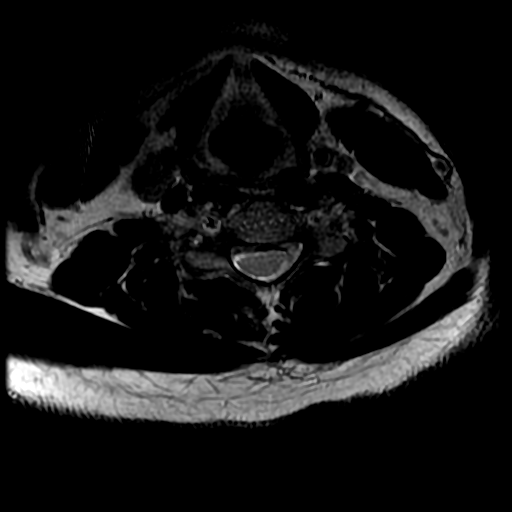
[im 30/34]
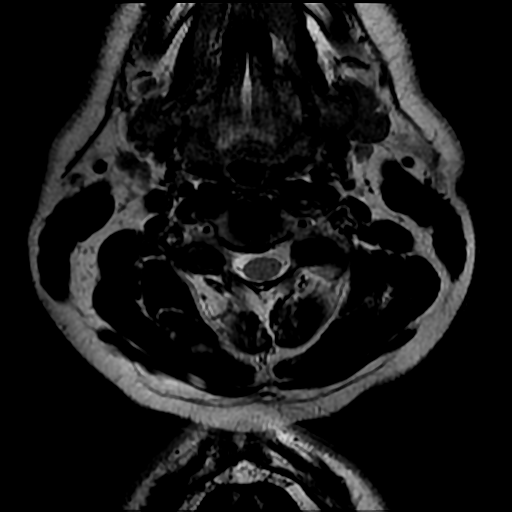

[18 of 48 positions shown; findings below may reference images not displayed]

FINDINGS: Alignment: Mild reversal of cervical curvature.  No listhesis.

Vertebrae: No fracture, evidence of discitis, or bone lesion.

Cord: Normal signal and morphology.

Posterior Fossa, vertebral arteries, paraspinal tissues: Moderate
right and mild left maxillary sinus mucosal thickening.

Disc levels:

C2-3: Right greater than left facet hypertrophy with mild bilateral
foraminal stenosis. No canal stenosis.

C3-4: Stable disc osteophyte complex eccentric to the right. Right
greater than left uncovertebral and facet hypertrophy. Severe right
and mild left foraminal stenosis. Mild canal stenosis with slight
right anterior cord impingement.

C4-5: Stable disc osteophyte complex with right greater than left
uncovertebral and facet hypertrophy. Severe right and mild left
foraminal stenosis. Mild canal stenosis.

C5-6: Stable disc osteophyte complex eccentric to the right with
right greater than left uncovertebral and facet hypertrophy.
Moderate right and mild left foraminal stenosis. Mild canal stenosis
with slight right anterior cord impingement.

C6-7: Stable disc osteophyte complex with bilateral uncovertebral
and facet hypertrophy. Mild foraminal stenosis. No canal stenosis.

C7-T1: No significant disc displacement, foraminal stenosis, or
canal stenosis.
IMPRESSION: 1. Stable cervical MRI from 06/20/2017. No acute osseous or cord
signal abnormality.
2. Cervical spondylosis with prominent right-sided uncovertebral and
facet hypertrophy.
3. Multilevel mild canal stenosis. Mild right anterior cord
impingement at C3-4 and C5-6.
4. Severe right C3-4 and C4-5 foraminal stenosis. Multilevel mild
and moderate foraminal stenosis.

By: Jullon Lienad M.D.
